# Patient Record
Sex: Male | Born: 1970 | Race: Black or African American | Hispanic: No | Marital: Married | Smoking: Never smoker
Health system: Southern US, Community
[De-identification: ages and names within clinical notes are randomized; demographics above are authoritative.]

## PROBLEM LIST (undated history)

## (undated) DIAGNOSIS — K219 Gastro-esophageal reflux disease without esophagitis: Secondary | ICD-10-CM

## (undated) DIAGNOSIS — T7840XA Allergy, unspecified, initial encounter: Secondary | ICD-10-CM

## (undated) DIAGNOSIS — K2970 Gastritis, unspecified, without bleeding: Secondary | ICD-10-CM

## (undated) DIAGNOSIS — K210 Gastro-esophageal reflux disease with esophagitis: Secondary | ICD-10-CM

## (undated) HISTORY — DX: Gastritis, unspecified, without bleeding: K29.70

## (undated) HISTORY — PX: COLONOSCOPY: SHX174

## (undated) HISTORY — DX: Allergy, unspecified, initial encounter: T78.40XA

## (undated) HISTORY — DX: Gastro-esophageal reflux disease with esophagitis: K21.0

## (undated) HISTORY — DX: Gastro-esophageal reflux disease without esophagitis: K21.9

## (undated) HISTORY — PX: ESOPHAGOGASTRODUODENOSCOPY: SHX1529

---

## 2006-07-07 ENCOUNTER — Emergency Department: Payer: Self-pay | Admitting: Emergency Medicine

## 2011-08-26 ENCOUNTER — Ambulatory Visit: Payer: Self-pay | Admitting: Gastroenterology

## 2012-09-13 ENCOUNTER — Encounter (HOSPITAL_COMMUNITY): Payer: Self-pay | Admitting: Emergency Medicine

## 2012-09-13 ENCOUNTER — Emergency Department (HOSPITAL_COMMUNITY)
Admission: EM | Admit: 2012-09-13 | Discharge: 2012-09-13 | Disposition: A | Discharge status: Home / Self Care | Payer: Worker's Compensation | Attending: Emergency Medicine | Admitting: Emergency Medicine

## 2012-09-13 ENCOUNTER — Emergency Department (HOSPITAL_COMMUNITY): Payer: Worker's Compensation

## 2012-09-13 DIAGNOSIS — T148XXA Other injury of unspecified body region, initial encounter: Secondary | ICD-10-CM

## 2012-09-13 DIAGNOSIS — Y9289 Other specified places as the place of occurrence of the external cause: Secondary | ICD-10-CM | POA: Insufficient documentation

## 2012-09-13 DIAGNOSIS — S9780XA Crushing injury of unspecified foot, initial encounter: Secondary | ICD-10-CM | POA: Insufficient documentation

## 2012-09-13 DIAGNOSIS — W208XXA Other cause of strike by thrown, projected or falling object, initial encounter: Secondary | ICD-10-CM | POA: Insufficient documentation

## 2012-09-13 DIAGNOSIS — S99922A Unspecified injury of left foot, initial encounter: Secondary | ICD-10-CM

## 2012-09-13 DIAGNOSIS — Y939 Activity, unspecified: Secondary | ICD-10-CM | POA: Insufficient documentation

## 2012-09-13 MED ORDER — HYDROCODONE-ACETAMINOPHEN 5-325 MG PO TABS
1.0000 | ORAL_TABLET | Freq: Once | ORAL | Status: AC
  Administered 2012-09-13: 1 via ORAL
  Filled 2012-09-13: qty 1

## 2012-09-13 NOTE — ED Notes (Signed)
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

## 2012-09-13 NOTE — ED Provider Notes (Signed)
History  Scribed for Dustin Pel, PA-C/ Dustin Razor, MD, the patient was seen in room WTR6/WTR6. This chart was scribed by Dustin Brady. The patient's care started at 9:00 PM   CSN: [id number]  Arrival date & time 09/13/12  1946   First MD Initiated Contact with Patient 09/13/12 2001      Chief Complaint  Patient presents with  . Foot Injury     The history is provided by the patient. No language interpreter was used.   Dustin Brady is a 42 y.o. male who presents to the Emergency Department complaining of left foot pain after a piece of metal fell off a truck and landed on his foot while at work earlier today.  Pt reports that the pain is worse with walking.  He is experiencing swelling of the foot.  He has no other injuries.  He has taken nothing for the pain.  History reviewed. No pertinent past medical history.  Past Surgical History  Procedure Date  . Colonoscopy     Family History  Problem Relation Age of Onset  . Hypertension Father   . Diabetes Father   . Stroke Father   . Cancer Other     History  Substance Use Topics  . Smoking status: Never Smoker   . Smokeless tobacco: Not on file  . Alcohol Use: No      Review of Systems  Constitutional: Negative for fever and chills.  Respiratory: Negative for shortness of breath.   Gastrointestinal: Negative for nausea and vomiting.  Musculoskeletal: Positive for arthralgias (left foot pain).  Neurological: Negative for weakness.  All other systems reviewed and are negative.    Allergies  Review of patient's allergies indicates no known allergies.  Home Medications   Current Outpatient Rx  Name  Route  Sig  Dispense  Refill  . IBUPROFEN 200 MG PO TABS   Oral   Take 400 mg by mouth every 6 (six) hours as needed. Pain.         . ADULT MULTIVITAMIN W/MINERALS CH   Oral   Take 1 tablet by mouth daily.           BP 132/91  Pulse 68  Temp 98.7 F (37.1 C) (Oral)  Resp 14  SpO2  96%  Physical Exam  Nursing note and vitals reviewed. Constitutional: He is oriented to person, place, and time. He appears well-developed and well-nourished. No distress.  HENT:  Head: Normocephalic and atraumatic.  Eyes: EOM are normal.  Neck: Neck supple. No tracheal deviation present.  Cardiovascular: Normal rate.   Pulmonary/Chest: Effort normal. No respiratory distress.  Musculoskeletal: Normal range of motion.       Normal sensation to light touch.  Significant amount of swelling to left foot.  Normal ROM of ankle and toes.  Unable to apply pressure.  Normal cap refill.    Neurological: He is alert and oriented to person, place, and time.  Skin: Skin is warm and dry.  Psychiatric: He has a normal mood and affect. His behavior is normal.    ED Course  Procedures   DIAGNOSTIC STUDIES: Oxygen Saturation is 96% on room air, normal by my interpretation.    COORDINATION OF CARE:  8:03PM Ordered: DG Foot Complete Left 9:02 PM Discussed imaging results with pt.  Advised pt to return to work with light duty.  Will order pain medication now.  Will order crutches and post op boot.  Advised pt to elevate, ice, elevate, and take ibuprofen for  the pain.  Pt understands and agrees.   9:07PM Ordered: HYDROcodone-acetaminophen (NORCO/VICODIN) 5-325 MG per tablet 1 tablet-once  9:08PM Ordered: Crutches; Post op boot, referral to Ortho.  Labs Reviewed - No data to display Dg Foot Complete Left  09/13/2012  *RADIOLOGY REPORT*  Clinical Data: Piece of metal fell on foot.  Pain.  LEFT FOOT - COMPLETE 3+ VIEW  Comparison: None.  Findings:  Mild soft tissue prominence dorsal foot.  No fracture or dislocation.  No radiopaque foreign body.  IMPRESSION: No fracture.  Please see above.   Original Report Authenticated By: Dustin Brady, M.D.      1. Crush injury   2. Injury of foot, left       MDM  Pt has been advised of the symptoms that warrant their return to the ED. Patient has voiced  understanding and has agreed to follow-up with the PCP or specialist.  I personally performed the services described in this documentation, which was scribed in my presence. The recorded information has been reviewed and is accurate.          Dustin Matas, PA 09/13/12 [phone]

## 2012-09-13 NOTE — ED Provider Notes (Signed)
Medical screening examination/treatment/procedure(s) were performed by non-physician practitioner and as supervising physician I was immediately available for consultation/collaboration.   , MD 09/13/12 2333 

## 2012-09-19 ENCOUNTER — Other Ambulatory Visit: Payer: Self-pay | Admitting: Family Medicine

## 2012-09-19 ENCOUNTER — Ambulatory Visit
Admission: RE | Admit: 2012-09-19 | Discharge: 2012-09-19 | Disposition: A | Discharge status: Home / Self Care | Payer: Worker's Compensation | Source: Ambulatory Visit | Attending: Family Medicine | Admitting: Family Medicine

## 2012-09-19 DIAGNOSIS — M79672 Pain in left foot: Secondary | ICD-10-CM

## 2014-05-01 ENCOUNTER — Emergency Department: Payer: Self-pay | Admitting: Emergency Medicine

## 2014-12-02 IMAGING — CR RIGHT ELBOW - COMPLETE 3+ VIEW
1 series · 4 of 4 positions shown · non-contrast
Comparison: None.

CLINICAL DATA: Injury to right elbow, with soft tissue swelling and
pain.

EXAM:
RIGHT ELBOW - COMPLETE 3+ VIEW

[Series 1: x elbow lat right · 0.14mm/px · 4 of 4 slices shown]
[im 1/4]
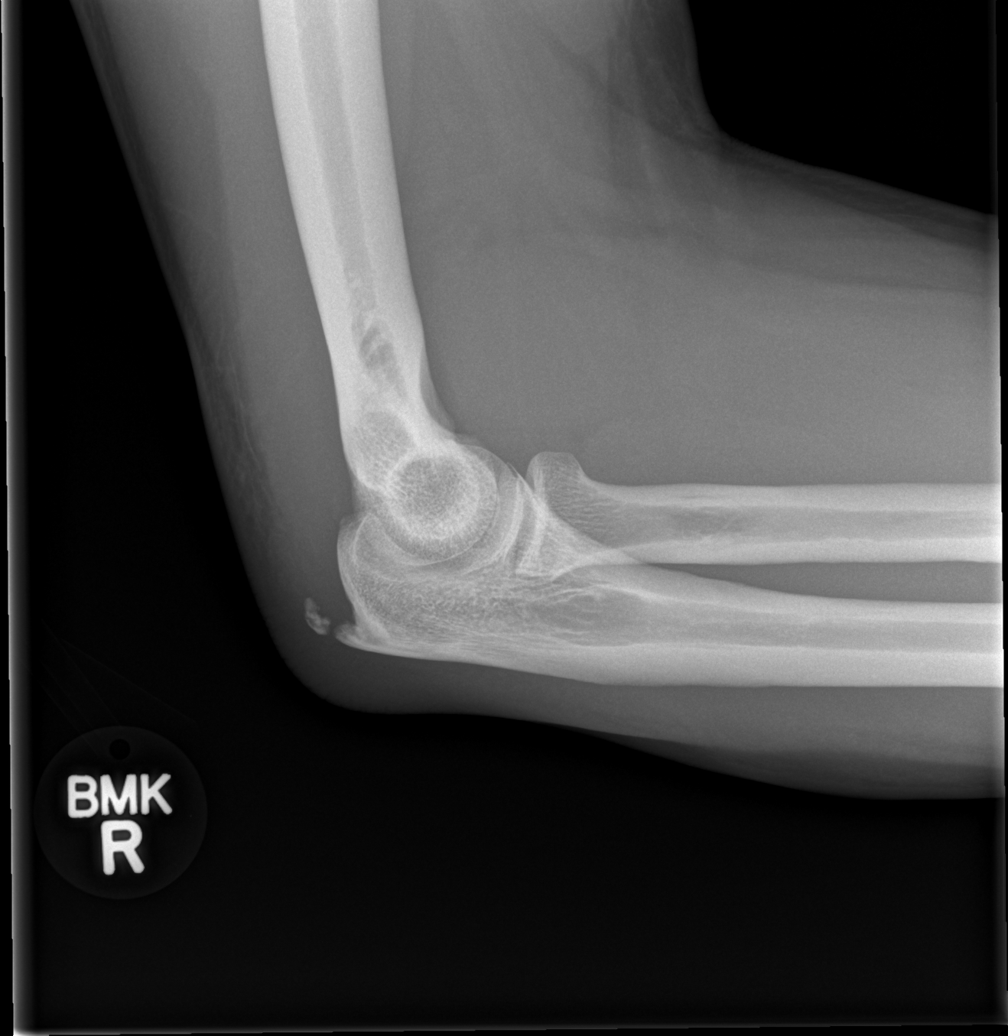
[im 2/4]
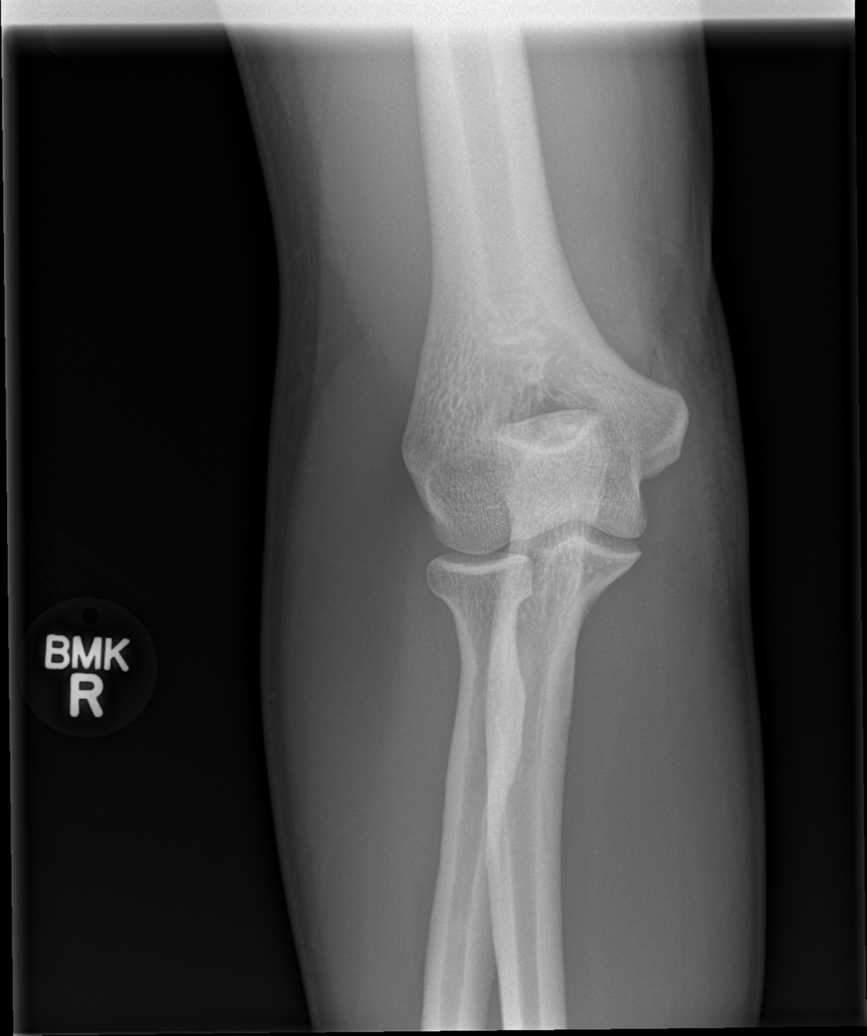
[im 3/4]
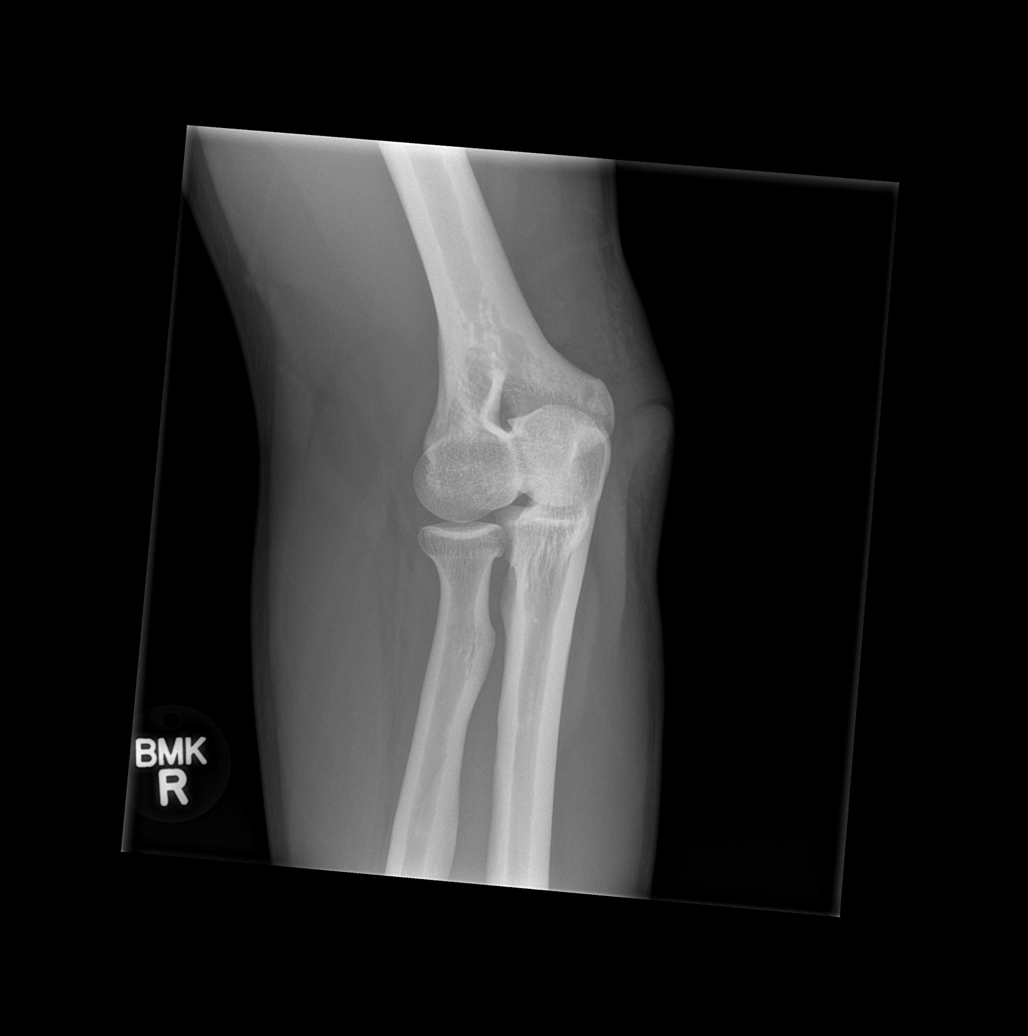
[im 4/4]
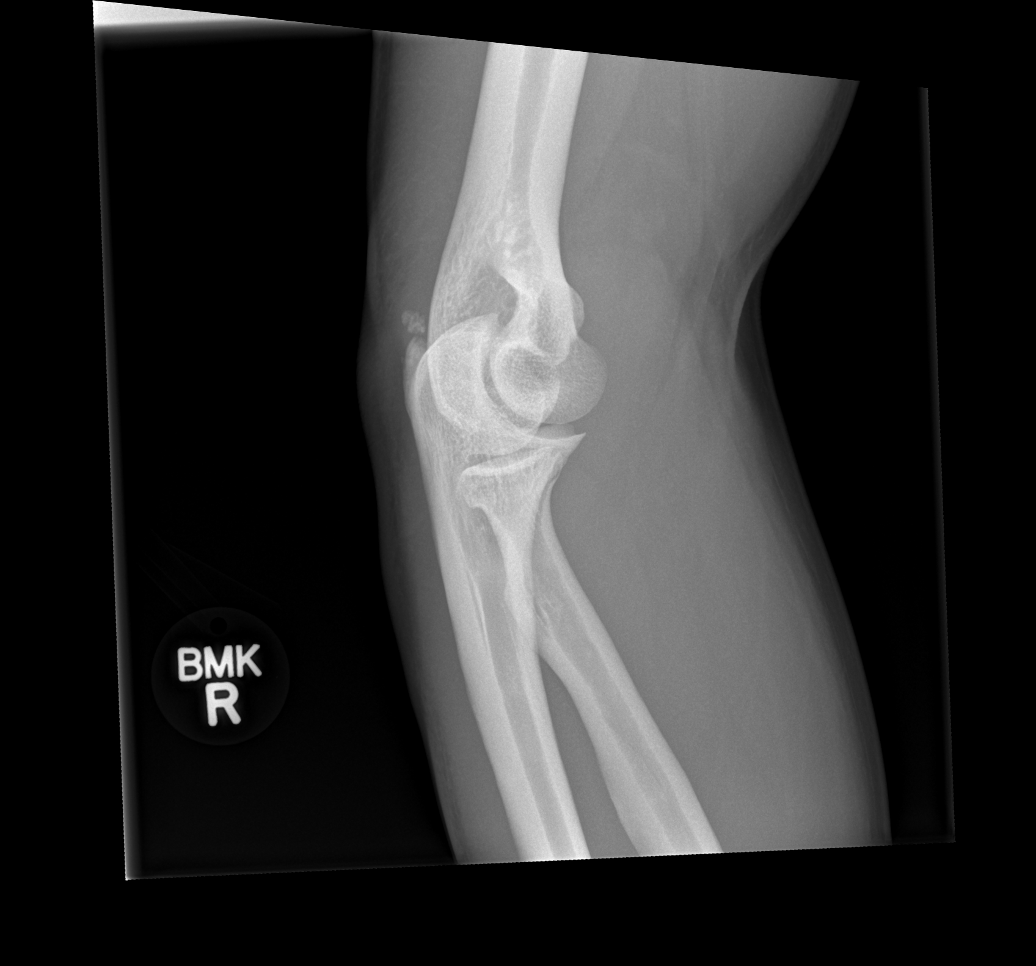

[4 of 4 positions shown; findings below may reference images not displayed]

FINDINGS: There is no evidence of fracture or dislocation. Focal calcification
is noted at the level of an enthesophyte along the olecranon. The
visualized joint spaces are preserved. No significant joint effusion
is identified. Dorsal soft tissue swelling is noted at the
olecranon.
IMPRESSION: 1. No evidence of fracture or dislocation.
2. Focal calcification noted at the level of an enthesophyte along
the olecranon, possibly reflecting remote injury.

## 2015-01-27 ENCOUNTER — Other Ambulatory Visit: Payer: Self-pay | Admitting: Family Medicine

## 2016-04-12 ENCOUNTER — Other Ambulatory Visit: Payer: Self-pay | Admitting: Family Medicine

## 2016-04-27 ENCOUNTER — Ambulatory Visit (INDEPENDENT_AMBULATORY_CARE_PROVIDER_SITE_OTHER): Payer: BLUE CROSS/BLUE SHIELD | Admitting: Family Medicine

## 2016-04-27 ENCOUNTER — Encounter: Payer: Self-pay | Admitting: Family Medicine

## 2016-04-27 DIAGNOSIS — R61 Generalized hyperhidrosis: Secondary | ICD-10-CM | POA: Insufficient documentation

## 2016-04-27 DIAGNOSIS — K219 Gastro-esophageal reflux disease without esophagitis: Secondary | ICD-10-CM

## 2016-04-27 DIAGNOSIS — R1904 Left lower quadrant abdominal swelling, mass and lump: Secondary | ICD-10-CM

## 2016-04-27 LAB — CBC WITH DIFFERENTIAL/PLATELET
BASOS ABS: 62 {cells}/uL (ref 0–200)
Basophils Relative: 1 %
EOS PCT: 3 %
Eosinophils Absolute: 186 cells/uL (ref 15–500)
HCT: 44.7 % (ref 38.5–50.0)
Hemoglobin: 15.3 g/dL (ref 13.2–17.1)
Lymphocytes Relative: 41 %
Lymphs Abs: 2542 cells/uL (ref 850–3900)
MCH: 31.2 pg (ref 27.0–33.0)
MCHC: 34.2 g/dL (ref 32.0–36.0)
MCV: 91.2 fL (ref 80.0–100.0)
MONOS PCT: 7 %
MPV: 11.9 fL (ref 7.5–12.5)
Monocytes Absolute: 434 cells/uL (ref 200–950)
NEUTROS ABS: 2976 {cells}/uL (ref 1500–7800)
Neutrophils Relative %: 48 %
PLATELETS: 233 10*3/uL (ref 140–400)
RBC: 4.9 MIL/uL (ref 4.20–5.80)
RDW: 13.7 % (ref 11.0–15.0)
WBC: 6.2 10*3/uL (ref 3.8–10.8)

## 2016-04-27 LAB — COMPREHENSIVE METABOLIC PANEL
ALBUMIN: 4.5 g/dL (ref 3.6–5.1)
ALK PHOS: 32 U/L — AB (ref 40–115)
ALT: 21 U/L (ref 9–46)
AST: 20 U/L (ref 10–40)
BILIRUBIN TOTAL: 0.5 mg/dL (ref 0.2–1.2)
BUN: 15 mg/dL (ref 7–25)
CO2: 27 mmol/L (ref 20–31)
CREATININE: 1.19 mg/dL (ref 0.60–1.35)
Calcium: 10 mg/dL (ref 8.6–10.3)
Chloride: 106 mmol/L (ref 98–110)
Glucose, Bld: 88 mg/dL (ref 65–99)
Potassium: 5 mmol/L (ref 3.5–5.3)
SODIUM: 138 mmol/L (ref 135–146)
TOTAL PROTEIN: 7.6 g/dL (ref 6.1–8.1)

## 2016-04-27 LAB — LACTATE DEHYDROGENASE: LDH: 143 U/L (ref 100–220)

## 2016-04-27 MED ORDER — PANTOPRAZOLE SODIUM 40 MG PO TBEC: 40.0000 mg | DELAYED_RELEASE_TABLET | Freq: Every day | ORAL | 3 refills | Status: DC

## 2016-04-27 NOTE — Assessment & Plan Note (Signed)
Check LDH 

## 2016-04-27 NOTE — Assessment & Plan Note (Signed)
Avoid triggers, protonix, labs

## 2016-04-27 NOTE — Progress Notes (Signed)
BP 120/74   Pulse 79   Temp 98.3 F (36.8 C) (Oral)   Ht 5\' 8"  (1.727 m)   Wt 208 lb (94.3 kg)   SpO2 97%   BMI 31.63 kg/m    Subjective:    Patient ID: Dustin Brady, male    DOB: [birth year], 45 y.o.   MRN: [id number]  HPI: Dustin Brady is a 45 y.o. male  Chief Complaint  Patient presents with  . Medication Refill  . LLQ    abdomen   Patient is here to get acid reflux refilled; used to take omeprazole but last dose months and months ago; helps and started back just taking one time to time; caused bloating daily Pretty bad pain over the left side; just started a week ago, off and on To him, it feels like that side is hard and swollen; working physically and works out [employer] mid left side and moves up to the LUQ Wants to start back on acid reflux Certain foods make acid reflux worse; typical ones I asked about blood in the stool, but not lately; he had colonoscopy, maybe 3-4 years ago He does not take much medicine, ibuprofen  Not weight loss; sometimes has night sweats; going on for years; father is the same way Father has diabetes; mother has MS; no GI malignancies; not sure about ulcer  Depression screen [facility] 2/9 04/27/2016  Decreased Interest 0  Down, Depressed, Hopeless 0  PHQ - 2 Score 0   Relevant past medical, surgical, family and social history reviewed Past Medical History:  Diagnosis Date  . GERD (gastroesophageal reflux disease)    Past Surgical History:  Procedure Laterality Date  . COLONOSCOPY     Family History  Problem Relation Age of Onset  . Hypertension Father   . Diabetes Father   . Stroke Father   . Cancer Other    Social History  Substance Use Topics  . Smoking status: Never Smoker  . Smokeless tobacco: Not on file  . Alcohol use No   Interim medical history since last visit reviewed. Allergies and medications reviewed  Review of Systems Per HPI unless specifically indicated above     Objective:    BP 120/74   Pulse 79   Temp  98.3 F (36.8 C) (Oral)   Ht 5\' 8"  (1.727 m)   Wt 208 lb (94.3 kg)   SpO2 97%   BMI 31.63 kg/m   Wt Readings from Last 3 Encounters:  04/27/16 208 lb (94.3 kg)    Physical Exam  Constitutional: He appears well-developed and well-nourished. No distress.  HENT:  Head: Normocephalic and atraumatic.  Eyes: EOM are normal. No scleral icterus.  Neck: No thyromegaly present.  Cardiovascular: Normal rate and regular rhythm.   Pulmonary/Chest: Effort normal and breath sounds normal.  Abdominal: Soft. Bowel sounds are normal. He exhibits mass (fullness in the LLQ). He exhibits no distension.  Musculoskeletal: He exhibits no edema.  Neurological: He is alert.  Skin: Skin is warm and dry. No pallor.  Psychiatric: He has a normal mood and affect. His behavior is normal. Judgment and thought content normal.      Assessment & Plan:   Problem List Items Addressed This Visit      Digestive   Acid reflux    Avoid triggers, protonix, labs      Relevant Medications   pantoprazole (PROTONIX) 40 MG tablet   Other Relevant Orders   Helicobacter pylori antigen det, stool     Other  Night sweats    Check LDH      Relevant Orders   Lactate Dehydrogenase (LDH) (Completed)   Abdominal mass, left lower quadrant    Check labs and CT scan      Relevant Orders   CBC with Differential/Platelet (Completed)   Urinalysis w microscopic + reflex cultur (Completed)   Comprehensive Metabolic Panel (CMET) (Completed)   CT Abdomen Pelvis W Contrast    Other Visit Diagnoses   None.     Follow up plan: Return in about 1 month (around 05/27/2016) for symptoms.  An after-visit summary was printed and given to the patient at check-out.  Please see the patient instructions which may contain other information and recommendations beyond what is mentioned above in the assessment and plan.  Meds ordered this encounter  Medications  . pantoprazole (PROTONIX) 40 MG tablet    Sig: Take 1 tablet (40 mg  total) by mouth daily.    Dispense:  30 tablet    Refill:  3    Orders Placed This Encounter  Procedures  . Helicobacter pylori special antigen  . CT Abdomen Pelvis W Contrast  . CBC with Differential/Platelet  . Urinalysis w microscopic + reflex cultur  . Helicobacter pylori antigen det, stool  . Comprehensive Metabolic Panel (CMET)  . Lactate Dehydrogenase (LDH)

## 2016-04-27 NOTE — Assessment & Plan Note (Signed)
Check labs and CT scan

## 2016-04-27 NOTE — Patient Instructions (Addendum)
We'll check labs today We'll get a CT scan If you have not heard anything from my staff in a week about any orders/referrals/studies from today, please contact us here to follow-up [phone] Try the pantoprazole daily for one month Avoid triggers  Gastroesophageal Reflux Disease, Adult Normally, food travels down the esophagus and stays in the stomach to be digested. However, when a person has gastroesophageal reflux disease (GERD), food and stomach acid move back up into the esophagus. When this happens, the esophagus becomes sore and inflamed. Over time, GERD can create small holes (ulcers) in the lining of the esophagus.  CAUSES This condition is caused by a problem with the muscle between the esophagus and the stomach (lower esophageal sphincter, or LES). Normally, the LES muscle closes after food passes through the esophagus to the stomach. When the LES is weakened or abnormal, it does not close properly, and that allows food and stomach acid to go back up into the esophagus. The LES can be weakened by certain dietary substances, medicines, and medical conditions, including:  Tobacco use.  Pregnancy.  Having a hiatal hernia.  Heavy alcohol use.  Certain foods and beverages, such as coffee, chocolate, onions, and peppermint. RISK FACTORS This condition is more likely to develop in:  People who have an increased body weight.  People who have connective tissue disorders.  People who use NSAID medicines. SYMPTOMS Symptoms of this condition include:  Heartburn.  Difficult or painful swallowing.  The feeling of having a lump in the throat.  Abitter taste in the mouth.  Bad breath.  Having a large amount of saliva.  Having an upset or bloated stomach.  Belching.  Chest pain.  Shortness of breath or wheezing.  Ongoing (chronic) cough or a night-time cough.  Wearing away of tooth enamel.  Weight loss. Different conditions can cause chest pain. Make sure to  see your health care provider if you experience chest pain. DIAGNOSIS Your health care provider will take a medical history and perform a physical exam. To determine if you have mild or severe GERD, your health care provider may also monitor how you respond to treatment. You may also have other tests, including:  An endoscopy toexamine your stomach and esophagus with a small camera.  A test thatmeasures the acidity level in your esophagus.  A test thatmeasures how much pressure is on your esophagus.  A barium swallow or modified barium swallow to show the shape, size, and functioning of your esophagus. TREATMENT The goal of treatment is to help relieve your symptoms and to prevent complications. Treatment for this condition may vary depending on how severe your symptoms are. Your health care provider may recommend:  Changes to your diet.  Medicine.  Surgery. HOME CARE INSTRUCTIONS Diet  Follow a diet as recommended by your health care provider. This may involve avoiding foods and drinks such as:  Coffee and tea (with or without caffeine).  Drinks that containalcohol.  Energy drinks and sports drinks.  Carbonated drinks or sodas.  Chocolate and cocoa.  Peppermint and mint flavorings.  Garlic and onions.  Horseradish.  Spicy and acidic foods, including peppers, chili powder, curry powder, vinegar, hot sauces, and barbecue sauce.  Citrus fruit juices and citrus fruits, such as oranges, lemons, and limes.  Tomato-based foods, such as red sauce, chili, salsa, and pizza with red sauce.  Fried and fatty foods, such as donuts, french fries, potato chips, and high-fat dressings.  High-fat meats, such as hot dogs and fatty cuts  of red and white meats, such as rib eye steak, sausage, ham, and bacon.  High-fat dairy items, such as whole milk, butter, and cream cheese.  Eat small, frequent meals instead of large meals.  Avoid drinking large amounts of liquid with your  meals.  Avoid eating meals during the 2-3 hours before bedtime.  Avoid lying down right after you eat.  Do not exercise right after you eat. General Instructions  Pay attention to any changes in your symptoms.  Take over-the-counter and prescription medicines only as told by your health care provider. Do not take aspirin, ibuprofen, or other NSAIDs unless your health care provider told you to do so.  Do not use any tobacco products, including cigarettes, chewing tobacco, and e-cigarettes. If you need help quitting, ask your health care provider.  Wear loose-fitting clothing. Do not wear anything tight around your waist that causes pressure on your abdomen.  Raise (elevate) the head of your bed 6 inches (15cm).  Try to reduce your stress, such as with yoga or meditation. If you need help reducing stress, ask your health care provider.  If you are overweight, reduce your weight to an amount that is healthy for you. Ask your health care provider for guidance about a safe weight loss goal.  Keep all follow-up visits as told by your health care provider. This is important. SEEK MEDICAL CARE IF:  You have new symptoms.  You have unexplained weight loss.  You have difficulty swallowing, or it hurts to swallow.  You have wheezing or a persistent cough.  Your symptoms do not improve with treatment.  You have a hoarse voice. SEEK IMMEDIATE MEDICAL CARE IF:  You have pain in your arms, neck, jaw, teeth, or back.  You feel sweaty, dizzy, or light-headed.  You have chest pain or shortness of breath.  You vomit and your vomit looks like blood or coffee grounds.  You faint.  Your stool is bloody or black.  You cannot swallow, drink, or eat.   This information is not intended to replace advice given to you by your health care provider. Make sure you discuss any questions you have with your health care provider.   Document Released: 05/11/2005 Document Revised: 04/22/2015  Document Reviewed: 11/26/2014 Elsevier Interactive Patient Education [employer]

## 2016-04-28 ENCOUNTER — Other Ambulatory Visit: Payer: Self-pay | Admitting: Family Medicine

## 2016-04-28 LAB — URINALYSIS W MICROSCOPIC + REFLEX CULTURE
BILIRUBIN URINE: NEGATIVE
Bacteria, UA: NONE SEEN [HPF]
CASTS: NONE SEEN [LPF]
CRYSTALS: NONE SEEN [HPF]
Glucose, UA: NEGATIVE
Hgb urine dipstick: NEGATIVE
Ketones, ur: NEGATIVE
Leukocytes, UA: NEGATIVE
Nitrite: NEGATIVE
Protein, ur: NEGATIVE
RBC / HPF: NONE SEEN RBC/HPF (ref <=2)
SPECIFIC GRAVITY, URINE: 1.021 (ref 1.001–1.035)
Squamous Epithelial / LPF: NONE SEEN [HPF] (ref <=5)
WBC UA: NONE SEEN WBC/HPF (ref <=5)
Yeast: NONE SEEN [HPF]
pH: 6 (ref 5.0–8.0)

## 2016-04-29 ENCOUNTER — Telehealth: Payer: Self-pay

## 2016-04-29 LAB — HELICOBACTER PYLORI  SPECIAL ANTIGEN: H. PYLORI ANTIGEN STOOL: NOT DETECTED

## 2016-04-29 NOTE — Telephone Encounter (Signed)
Forgot to document on result notes of pt lab. Pt notified on neg urine.

## 2016-05-02 ENCOUNTER — Ambulatory Visit: Admission: RE | Admit: 2016-05-02 | Payer: BLUE CROSS/BLUE SHIELD | Source: Ambulatory Visit

## 2016-05-05 ENCOUNTER — Telehealth: Payer: Self-pay | Admitting: Family Medicine

## 2016-05-05 ENCOUNTER — Other Ambulatory Visit: Payer: Self-pay

## 2016-05-05 DIAGNOSIS — R1032 Left lower quadrant pain: Secondary | ICD-10-CM

## 2016-05-05 NOTE — Telephone Encounter (Signed)
Please let pt know we have his scan results I'm sorry to hear that he vomited at the test Is he feeling better in that regard? He has diverticulosis of the colon, which are little outpouching of the walls of the large intestine, but it is not infected (not diverticulitis) He has mild degenerative changes in his back Overall, no masses, no hernia, no gallstones, no kidney stones No enlarged lymph nodes, no evidence of lymphoma If he's continuing to have bad reflux and pain in the LUQ, then please refer him to GI Call if any ongoing issues

## 2016-05-05 NOTE — Telephone Encounter (Signed)
Pt notified of results and would like to go ahead with GI referral.  It has been placed.

## 2016-05-26 ENCOUNTER — Ambulatory Visit: Payer: BLUE CROSS/BLUE SHIELD | Admitting: Family Medicine

## 2016-06-21 ENCOUNTER — Other Ambulatory Visit: Payer: Self-pay

## 2016-06-21 DIAGNOSIS — K219 Gastro-esophageal reflux disease without esophagitis: Secondary | ICD-10-CM

## 2016-06-21 NOTE — Telephone Encounter (Signed)
Ins requesting 90 day supply 

## 2016-06-22 MED ORDER — PANTOPRAZOLE SODIUM 40 MG PO TBEC: 40.0000 mg | DELAYED_RELEASE_TABLET | Freq: Every day | ORAL | 0 refills | Status: DC

## 2016-06-27 ENCOUNTER — Telehealth: Payer: Self-pay | Admitting: Family Medicine

## 2016-06-27 NOTE — Telephone Encounter (Signed)
PT WIFE SAYS PT NEEDS REFILL ON PANTOPRAZOLE SOD DR 40 MG. PT SAYS THAT INS WILL ON REFILL A 30 DAY SUPPLY NEES TO BE 90 DAY SUPPLY. PHARM IS [facility] [address]

## 2016-07-04 ENCOUNTER — Ambulatory Visit: Payer: BLUE CROSS/BLUE SHIELD | Admitting: Family Medicine

## 2016-08-16 ENCOUNTER — Telehealth: Payer: Self-pay | Admitting: Emergency Medicine

## 2016-08-16 ENCOUNTER — Other Ambulatory Visit: Payer: Self-pay | Admitting: Emergency Medicine

## 2016-08-16 MED ORDER — OSELTAMIVIR PHOSPHATE 75 MG PO CAPS: 75.0000 mg | ORAL_CAPSULE | Freq: Two times a day (BID) | ORAL | 0 refills | Status: DC

## 2016-08-16 NOTE — Telephone Encounter (Signed)
Mr. Manson PasseyBrown wife called and stated that Lisbeth PlyJulius is having flu symptoms. [facility]. Per Dr. Carlynn PurlSowles call in Tamiflu 75 mg twice a day for 5 days. [facility] [address]

## 2016-09-29 ENCOUNTER — Other Ambulatory Visit: Payer: Self-pay | Admitting: Family Medicine

## 2016-09-29 DIAGNOSIS — K219 Gastro-esophageal reflux disease without esophagitis: Secondary | ICD-10-CM

## 2016-09-29 NOTE — Telephone Encounter (Signed)
I referred him to gastroenterologist in Sept; please ask him to contact his GI doctor, as the medicine may have been changed, or other f/u may be needed; thank you

## 2016-10-05 ENCOUNTER — Encounter: Payer: Self-pay | Admitting: Family Medicine

## 2016-10-05 DIAGNOSIS — K2970 Gastritis, unspecified, without bleeding: Secondary | ICD-10-CM

## 2016-10-05 DIAGNOSIS — K2100 Gastro-esophageal reflux disease with esophagitis, without bleeding: Secondary | ICD-10-CM

## 2016-10-05 HISTORY — DX: Gastritis, unspecified, without bleeding: K29.70

## 2016-10-05 HISTORY — DX: Gastro-esophageal reflux disease with esophagitis, without bleeding: K21.00

## 2016-10-06 ENCOUNTER — Encounter: Payer: Self-pay | Admitting: Family Medicine

## 2016-11-04 ENCOUNTER — Encounter: Payer: Self-pay | Admitting: *Deleted

## 2016-11-07 ENCOUNTER — Ambulatory Visit
Admission: RE | Admit: 2016-11-07 | Discharge: 2016-11-07 | Disposition: A | Discharge status: Home / Self Care | Payer: BLUE CROSS/BLUE SHIELD | Source: Ambulatory Visit | Attending: Gastroenterology | Admitting: Gastroenterology

## 2016-11-07 ENCOUNTER — Encounter
Admission: RE | Disposition: A | Discharge status: Home / Self Care | Payer: Self-pay | Source: Ambulatory Visit | Attending: Gastroenterology

## 2016-11-07 ENCOUNTER — Ambulatory Visit: Payer: BLUE CROSS/BLUE SHIELD | Admitting: Certified Registered Nurse Anesthetist

## 2016-11-07 ENCOUNTER — Encounter: Payer: Self-pay | Admitting: *Deleted

## 2016-11-07 DIAGNOSIS — K210 Gastro-esophageal reflux disease with esophagitis: Secondary | ICD-10-CM | POA: Diagnosis not present

## 2016-11-07 DIAGNOSIS — Z09 Encounter for follow-up examination after completed treatment for conditions other than malignant neoplasm: Secondary | ICD-10-CM | POA: Insufficient documentation

## 2016-11-07 DIAGNOSIS — K2950 Unspecified chronic gastritis without bleeding: Secondary | ICD-10-CM | POA: Insufficient documentation

## 2016-11-07 HISTORY — PX: ESOPHAGOGASTRODUODENOSCOPY (EGD) WITH PROPOFOL: SHX5813

## 2016-11-07 SURGERY — ESOPHAGOGASTRODUODENOSCOPY (EGD) WITH PROPOFOL
Anesthesia: General

## 2016-11-07 MED ORDER — LIDOCAINE HCL (CARDIAC) 20 MG/ML IV SOLN
INTRAVENOUS | Status: DC | PRN
  Administered 2016-11-07: 100 mg via INTRAVENOUS

## 2016-11-07 MED ORDER — LIDOCAINE HCL (PF) 2 % IJ SOLN
INTRAMUSCULAR | Status: AC
  Filled 2016-11-07: qty 2

## 2016-11-07 MED ORDER — FENTANYL CITRATE (PF) 100 MCG/2ML IJ SOLN
INTRAMUSCULAR | Status: DC | PRN
  Administered 2016-11-07: 50 ug via INTRAVENOUS

## 2016-11-07 MED ORDER — SODIUM CHLORIDE 0.9 % IV SOLN: INTRAVENOUS | Status: DC

## 2016-11-07 MED ORDER — PROPOFOL 10 MG/ML IV BOLUS
INTRAVENOUS | Status: DC | PRN
  Administered 2016-11-07: 70 mg via INTRAVENOUS

## 2016-11-07 MED ORDER — SODIUM CHLORIDE 0.9 % IV SOLN
INTRAVENOUS | Status: DC
  Administered 2016-11-07: 13:00:00 via INTRAVENOUS

## 2016-11-07 MED ORDER — FENTANYL CITRATE (PF) 100 MCG/2ML IJ SOLN
INTRAMUSCULAR | Status: AC
  Filled 2016-11-07: qty 2

## 2016-11-07 MED ORDER — PROPOFOL 500 MG/50ML IV EMUL
INTRAVENOUS | Status: DC | PRN
  Administered 2016-11-07: 140 ug/kg/min via INTRAVENOUS

## 2016-11-07 MED ORDER — PROPOFOL 500 MG/50ML IV EMUL
INTRAVENOUS | Status: AC
  Filled 2016-11-07: qty 50

## 2016-11-07 NOTE — Anesthesia Post-op Follow-up Note (Cosign Needed)
Anesthesia QCDR form completed.        

## 2016-11-07 NOTE — Transfer of Care (Signed)
Immediate Anesthesia Transfer of Care Note  Patient: Dustin Brady  Procedure(s) Performed: Procedure(s): ESOPHAGOGASTRODUODENOSCOPY (EGD) WITH PROPOFOL (N/A)  Patient Location: PACU  Anesthesia Type:General  Level of Consciousness: sedated  Airway & Oxygen Therapy: Patient Spontanous Breathing and Patient connected to nasal cannula oxygen  Post-op Assessment: Report given to RN and Post -op Vital signs reviewed and stable  Post vital signs: Reviewed and stable  Last Vitals:  Vitals:   11/07/16 1255  BP: 132/90  Pulse: 69  Resp: 20  Temp: 36.5 C    Last Pain:  Vitals:   11/07/16 1255  TempSrc: Tympanic         Complications: No apparent anesthesia complications

## 2016-11-07 NOTE — H&P (Signed)
Outpatient short stay form Pre-procedure 11/07/2016 1:19 PM Dustin DeemMartin U  MD  Primary Physician: Dr. Baruch GoutyMelinda Lada  Reason for visit:  EGD  History of present illness:  Patient is a 46 year old male presenting today as above. He had an EGD on 08/24/2016 for issues with heartburn. He was found to have a severe erosive gastritis this was negative for local back to pylori. He is returning today for follow-up. He is been continuing on a proton pump inhibitor currently twice daily. He has no further symptoms. He takes no over-the-counter NSAIDs or aspirin products currently.    Current Facility-Administered Medications:  .  0.9 %  sodium chloride infusion, , Intravenous, Continuous, Dustin Brady , MD, Last Rate: 20 mL/hr at 11/07/16 1311 .  0.9 %  sodium chloride infusion, , Intravenous, Continuous, Dustin Brady , MD  Prescriptions Prior to Admission  Medication Sig Dispense Refill Last Dose  . Multiple Vitamin (MULTIVITAMIN WITH MINERALS) TABS Take 1 tablet by mouth daily.   Past Month at Unknown time  . pantoprazole (PROTONIX) 40 MG tablet Take 1 tablet (40 mg total) by mouth daily. 90 tablet 0 11/06/2016 at Unknown time  . oseltamivir (TAMIFLU) 75 MG capsule Take 1 capsule (75 mg total) by mouth 2 (two) times daily. (Patient not taking: Reported on 11/07/2016) 10 capsule 0 Completed Course at Unknown time     No Known Allergies   Past Medical History:  Diagnosis Date  . Chronic reflux esophagitis 10/05/2016  . Gastritis 10/05/2016   Mild to moderate chronic gastritis, EGD Jan 2018, Dr. Marva Panda  . GERD (gastroesophageal reflux disease)     Review of systems:      Physical Exam    Heart and lungs: Regular rate and rhythm without rub or gallop, lungs are bilaterally clear.    HEENT: Normocephalic atraumatic eyes are anicteric    Other:     Pertinant exam for procedure: Soft nontender nondistended bowel sounds positive normoactive.    Planned proceedures: EGD and  indicated procedures. I have discussed the risks benefits and complications of procedures to include not limited to bleeding, infection, perforation and the risk of sedation and the patient wishes to proceed.    Dustin Brady , MD Gastroenterology 11/07/2016  1:19 PM

## 2016-11-07 NOTE — Anesthesia Preprocedure Evaluation (Signed)
Anesthesia Evaluation  Patient identified by MRN, date of birth, ID band Patient awake    Reviewed: Allergy & Precautions, H&P , NPO status , Patient's Chart, lab work & pertinent test results, reviewed documented beta blocker date and time   History of Anesthesia Complications Negative for: history of anesthetic complications  Airway Mallampati: II  TM Distance: >3 FB Neck ROM: full    Dental  (+) Teeth Intact   Pulmonary neg pulmonary ROS,           Cardiovascular Exercise Tolerance: Good negative cardio ROS       Neuro/Psych negative neurological ROS  negative psych ROS   GI/Hepatic Neg liver ROS, GERD  ,  Endo/Other  negative endocrine ROS  Renal/GU negative Renal ROS  negative genitourinary   Musculoskeletal   Abdominal   Peds  Hematology negative hematology ROS (+)   Anesthesia Other Findings Past Medical History: 10/05/2016: Chronic reflux esophagitis 10/05/2016: Gastritis     Comment: Mild to moderate chronic gastritis, EGD Jan               2018, [place] date: GERD (gastroesophageal reflux disease)   Reproductive/Obstetrics negative OB ROS                             Anesthesia Physical Anesthesia Plan  ASA: II  Anesthesia Plan: General   Post-op Pain Management:    Induction:   Airway Management Planned:   Additional Equipment:   Intra-op Plan:   Post-operative Plan:   Informed Consent: I have reviewed the patients History and Physical, chart, labs and discussed the procedure including the risks, benefits and alternatives for the proposed anesthesia with the patient or authorized representative who has indicated his/her understanding and acceptance.   Dental Advisory Given  Plan Discussed with: Anesthesiologist, CRNA and Surgeon  Anesthesia Plan Comments:         Anesthesia Quick Evaluation

## 2016-11-07 NOTE — Op Note (Signed)
[facility] Gastroenterology Patient Name: Dustin Brady Procedure Date: 11/07/2016 2:05 PM MRN: [id number] Account #: [id number] Date of Birth: 10/17/1970 Admit Type: Outpatient Age: 46 Room: [facility] ENDO ROOM 3 Gender: Male Note Status: Finalized Procedure:            Upper GI endoscopy Indications:          Follow-up of gastritis Providers:            Christena Deem, MD Medicines:            Monitored Anesthesia Care Complications:        No immediate complications. Procedure:            Pre-Anesthesia Assessment:                       - ASA Grade Assessment: II - A patient with mild                        systemic disease.                       After obtaining informed consent, the endoscope was                        passed under direct vision. Throughout the procedure,                        the patient's blood pressure, pulse, and oxygen                        saturations were monitored continuously. The Endoscope                        was introduced through the mouth, and advanced to the                        fourth part of duodenum. The patient tolerated the                        procedure well. Findings:      The Z-line was variable. Biopsies were taken with a cold forceps for       histology.      The entire examined stomach was normal. Biopsies were taken with a cold       forceps for histology. Biopsies were taken with a cold forceps for       Helicobacter pylori testing.      The cardia and gastric fundus were normal on retroflexion.      The examined duodenum was normal. Impression:           - Z-line variable. Biopsied.                       - Normal stomach. Biopsied.                       - Normal examined duodenum. Recommendation:       - Use Protonix (pantoprazole) 40 mg PO daily daily. Procedure Code(s):    --- Professional ---                       [phone], Esophagogastroduodenoscopy, flexible, transoral;  with  biopsy, single or multiple Diagnosis Code(s):    --- Professional ---                       K22.8, Other specified diseases of esophagus                       K29.70, Gastritis, unspecified, without bleeding CPT copyright 2016 American Medical Association. All rights reserved. The codes documented in this report are preliminary and upon coder review may  be revised to meet current compliance requirements. Christena DeemMartin U , MD 11/07/2016 2:20:59 PM This report has been signed electronically. Number of Addenda: 0 Note Initiated On: 11/07/2016 2:05 PM      [facility]

## 2016-11-08 ENCOUNTER — Encounter: Payer: Self-pay | Admitting: Gastroenterology

## 2016-11-08 NOTE — Anesthesia Postprocedure Evaluation (Signed)
Anesthesia Post Note  Patient: Shea EvansJulius Behanna  Procedure(s) Performed: Procedure(s) (LRB): ESOPHAGOGASTRODUODENOSCOPY (EGD) WITH PROPOFOL (N/A)  Patient location during evaluation: PACU Anesthesia Type: General Level of consciousness: awake and alert Pain management: pain level controlled Vital Signs Assessment: post-procedure vital signs reviewed and stable Respiratory status: spontaneous breathing, nonlabored ventilation, respiratory function stable and patient connected to nasal cannula oxygen Cardiovascular status: blood pressure returned to baseline and stable Postop Assessment: no signs of nausea or vomiting Anesthetic complications: no     Last Vitals:  Vitals:   11/07/16 1444 11/07/16 1454  BP: 107/77 111/74  Pulse: 67 62  Resp: 16 18  Temp:      Last Pain:  Vitals:   11/08/16 0745  TempSrc:   PainSc: 0-No pain                 Yevette EdwardsJames G 

## 2016-11-09 LAB — SURGICAL PATHOLOGY

## 2017-10-20 ENCOUNTER — Encounter: Payer: Self-pay | Admitting: Family Medicine

## 2017-10-20 ENCOUNTER — Ambulatory Visit: Payer: BLUE CROSS/BLUE SHIELD | Admitting: Family Medicine

## 2017-10-20 VITALS — BP 100/70 | HR 90 | Resp 14 | Ht 67.0 in | Wt 217.8 lb

## 2017-10-20 DIAGNOSIS — F5102 Adjustment insomnia: Secondary | ICD-10-CM | POA: Diagnosis not present

## 2017-10-20 DIAGNOSIS — L918 Other hypertrophic disorders of the skin: Secondary | ICD-10-CM | POA: Diagnosis not present

## 2017-10-20 DIAGNOSIS — Z23 Encounter for immunization: Secondary | ICD-10-CM

## 2017-10-20 MED ORDER — TEMAZEPAM 15 MG PO CAPS: 15.0000 mg | ORAL_CAPSULE | Freq: Every evening | ORAL | 0 refills | Status: DC | PRN

## 2017-10-20 NOTE — Progress Notes (Signed)
Name: Dustin Brady   MRN: [id number]    DOB: 04/11/1971   Date:10/20/2017       Progress Note  Subjective  Chief Complaint  Chief Complaint  Patient presents with  . skin tag    removal of skin tags on the back of his neck and back.    HPI  Skin tags: he has a few skin tags around neck that bothers him when at work. It gets caught on his collar shirts. No bleeding but some of the lesions are growing. No previous history of skin cancer, no change in color of lesions.   Insomnia: he lost his mother and father within the past 4 months, still grieving and it is affecting his ability to fall asleep. He is getting at most 5 hours of sleep during the week, trying to sleep longer on the weekends.   Patient Active Problem List   Diagnosis Date Noted  . Chronic reflux esophagitis 10/05/2016  . Gastritis 10/05/2016  . Abdominal mass, left lower quadrant 04/27/2016  . Acid reflux 04/27/2016  . Night sweats 04/27/2016    Past Surgical History:  Procedure Laterality Date  . COLONOSCOPY    . ESOPHAGOGASTRODUODENOSCOPY    . ESOPHAGOGASTRODUODENOSCOPY (EGD) WITH PROPOFOL N/A 11/07/2016   Procedure: ESOPHAGOGASTRODUODENOSCOPY (EGD) WITH PROPOFOL;  Surgeon: Christena Deem, MD;  Location: [facility] ENDOSCOPY;  Service: Endoscopy;  Laterality: N/A;    Family History  Problem Relation Age of Onset  . Hypertension Father   . Diabetes Father   . Stroke Father   . Cancer Other     Social History   Socioeconomic History  . Marital status: Married    Spouse name: Not on file  . Number of children: 3  . Years of education: Not on file  . Highest education level: 12th grade  Social Needs  . Financial resource strain: Not on file  . Food insecurity - worry: Not on file  . Food insecurity - inability: Not on file  . Transportation needs - medical: Not on file  . Transportation needs - non-medical: Not on file  Occupational History  . Occupation: driver - trucks     Comment: [employer]  Tobacco Use   . Smoking status: Never Smoker  . Smokeless tobacco: Never Used  Substance and Sexual Activity  . Alcohol use: No  . Drug use: No  . Sexual activity: Yes    Partners: Female    Comment: wife had hysterectomy   Other Topics Concern  . Not on file  Social History Narrative  . Not on file     Current Outpatient Medications:  Marland Kitchen  Multiple Vitamin (MULTIVITAMIN WITH MINERALS) TABS, Take 1 tablet by mouth daily., Disp: , Rfl:  .  pantoprazole (PROTONIX) 40 MG tablet, Take 1 tablet (40 mg total) by mouth daily., Disp: 90 tablet, Rfl: 0  No Known Allergies   ROS  Ten systems reviewed and is negative except as mentioned in HPI   Objective  Vitals:   10/20/17 1045  BP: 100/70  Pulse: 90  Resp: 14  SpO2: 97%  Weight: 217 lb 12.8 oz (98.8 kg)  Height: 5\' 7"  (1.702 m)    Body mass index is 34.11 kg/m.  Physical Exam Constitutional: Patient appears well-developed and well-nourished. Obese  No distress.  HEENT: head atraumatic, normocephalic, pupils equal and reactive to light,  neck supple, throat within normal limits Cardiovascular: Normal rate, regular rhythm and normal heart sounds.  No murmur heard. No BLE edema. Pulmonary/Chest: Effort normal  and breath sounds normal. No respiratory distress. Abdominal: Soft.  There is no tenderness. Psychiatric: Patient has a normal mood and affect. behavior is normal. Judgment and thought content normal. Skin: skin tags, on back and multiple on neck   PHQ2/9: Depression screen [facility] 2/9 10/20/2017 04/27/2016  Decreased Interest 0 0  Down, Depressed, Hopeless 0 0  PHQ - 2 Score 0 0  Altered sleeping 1 -  Tired, decreased energy 0 -  Change in appetite 0 -  Feeling bad or failure about yourself  0 -  Trouble concentrating 0 -  Moving slowly or fidgety/restless 0 -  Suicidal thoughts 0 -  PHQ-9 Score 1 -  Difficult doing work/chores Not difficult at all -     Fall Risk: Fall Risk  10/20/2017 04/27/2016  Falls in the past year? No  No     Functional Status Survey: Is the patient deaf or have difficulty hearing?: No Does the patient have difficulty seeing, even when wearing glasses/contacts?: No Does the patient have difficulty concentrating, remembering, or making decisions?: No Does the patient have difficulty walking or climbing stairs?: No Does the patient have difficulty dressing or bathing?: No Does the patient have difficulty doing errands alone such as visiting a doctor's office or shopping?: No   Assessment & Plan  1. Skin tag  Consent signed: YES  Procedure: Skin Mass Removal Location: neck and mid back  Equipment used: derma-blade, high temperature cautery, sterile scalpel, tweezers, curved scissors Anesthesia: 1% Lidocaine w/o Epinephrine  Cleaned and prepped: Betadine  After consent signed, are of skin prepped with betadine. Lidocaine w/o epinephrine injected into skin underneath skin mass. After properly numbed sterile equipment used to remove tag.  Specimen sent for pathology analysis. Instructed on proper care to allow for proper healing. F/U for nursing visit if needed.  2. Flu vaccine need  Refused   3. Adjustment insomnia  He is already getting hospice counseling  - temazepam (RESTORIL) 15 MG capsule; Take 1 capsule (15 mg total) by mouth at bedtime as needed for sleep.  Dispense: 30 capsule; Refill: 0   4. Need for diphtheria-tetanus-pertussis (Tdap) vaccine  - Tdap vaccine greater than or equal to 7yo IM

## 2019-11-04 ENCOUNTER — Ambulatory Visit: Payer: Self-pay | Attending: Internal Medicine

## 2019-11-04 DIAGNOSIS — Z23 Encounter for immunization: Secondary | ICD-10-CM

## 2019-11-04 NOTE — Progress Notes (Signed)
   Covid-19 Vaccination Clinic  Name:  Dustin Brady    MRN: [id number] DOB: [birth year]  11/04/2019  [name] was observed post Covid-19 immunization for 15 minutes without incident. He was provided with Vaccine Information Sheet and instruction to access the V-Safe system.   [name] was instructed to call 911 with any severe reactions post vaccine: Marland Kitchen Difficulty breathing  . Swelling of face and throat  . A fast heartbeat  . A bad rash all over body  . Dizziness and weakness   Immunizations Administered    Name Date Dose VIS Date Route   Pfizer COVID-19 Vaccine 11/04/2019 11:44 AM 0.3 mL 07/26/2019 Intramuscular   Manufacturer: [employer], [employer]   Lot: [id number]   NDC: [address]-7

## 2019-11-26 ENCOUNTER — Encounter: Payer: Self-pay | Admitting: Family Medicine

## 2019-11-26 ENCOUNTER — Other Ambulatory Visit: Payer: Self-pay

## 2019-11-26 ENCOUNTER — Ambulatory Visit (INDEPENDENT_AMBULATORY_CARE_PROVIDER_SITE_OTHER): Payer: BC Managed Care – PPO | Admitting: Family Medicine

## 2019-11-26 VITALS — BP 122/80 | HR 72 | Temp 97.2°F | Resp 18 | Ht 67.0 in | Wt 217.2 lb

## 2019-11-26 DIAGNOSIS — M25471 Effusion, right ankle: Secondary | ICD-10-CM

## 2019-11-26 DIAGNOSIS — M7021 Olecranon bursitis, right elbow: Secondary | ICD-10-CM

## 2019-11-26 DIAGNOSIS — Z130 Encounter for screening for diseases of the blood and blood-forming organs and certain disorders involving the immune mechanism: Secondary | ICD-10-CM

## 2019-11-26 DIAGNOSIS — Z114 Encounter for screening for human immunodeficiency virus [HIV]: Secondary | ICD-10-CM

## 2019-11-26 DIAGNOSIS — Z0000 Encounter for general adult medical examination without abnormal findings: Secondary | ICD-10-CM | POA: Diagnosis not present

## 2019-11-26 DIAGNOSIS — Z131 Encounter for screening for diabetes mellitus: Secondary | ICD-10-CM | POA: Diagnosis not present

## 2019-11-26 DIAGNOSIS — Z13220 Encounter for screening for lipoid disorders: Secondary | ICD-10-CM | POA: Diagnosis not present

## 2019-11-26 DIAGNOSIS — Z1159 Encounter for screening for other viral diseases: Secondary | ICD-10-CM

## 2019-11-26 DIAGNOSIS — N529 Male erectile dysfunction, unspecified: Secondary | ICD-10-CM

## 2019-11-26 DIAGNOSIS — Z8719 Personal history of other diseases of the digestive system: Secondary | ICD-10-CM | POA: Diagnosis not present

## 2019-11-26 DIAGNOSIS — Z1211 Encounter for screening for malignant neoplasm of colon: Secondary | ICD-10-CM

## 2019-11-26 MED ORDER — VITAMIN D 50 MCG (2000 UT) PO CAPS: 1.0000 | ORAL_CAPSULE | Freq: Every day | ORAL | 0 refills | Status: DC

## 2019-11-26 MED ORDER — TADALAFIL 20 MG PO TABS: 10.0000 mg | ORAL_TABLET | ORAL | 0 refills | Status: DC | PRN

## 2019-11-26 NOTE — Progress Notes (Signed)
Name: Dustin Brady   MRN: [id number]    DOB: 1971-02-03   Date:11/26/2019       Progress Note  Subjective  Chief Complaint  Chief Complaint  Patient presents with  . Annual Exam    HPI  Patient presents for annual CPE and discuss a few things  Ankle pain/Foot pain: going on for years, he notices intermittent pain, swelling, redness and increase in warmth. He states seems to happen after eating shellfish and travelling . Episodes about once a month, improves with rest, tylenol prn   Bursitis right elbow: he has history of injury on right elbow. He sates has swelling and tenderness intermittently but at least once a month that lasts two days on average.   ED: wife has noticed not lasting and not as hard as usual. He denies taking testosterone supplementation, we will try cialis prn for now  USPSTF grade A and B recommendations:  Diet: balanced , cooks at home  Exercise:   Depression: phq 9 is negative Depression screen [facility] 2/9 11/26/2019 10/20/2017 04/27/2016  Decreased Interest 0 0 0  Down, Depressed, Hopeless 0 0 0  PHQ - 2 Score 0 0 0  Altered sleeping 0 1 -  Tired, decreased energy 0 0 -  Change in appetite 0 0 -  Feeling bad or failure about yourself  0 0 -  Trouble concentrating 0 0 -  Moving slowly or fidgety/restless 0 0 -  Suicidal thoughts 0 0 -  PHQ-9 Score 0 1 -  Difficult doing work/chores Not difficult at all Not difficult at all -    Hypertension:  BP Readings from Last 3 Encounters:  11/26/19 122/80  10/20/17 100/70  11/07/16 111/74    Obesity: Wt Readings from Last 3 Encounters:  11/26/19 217 lb 3.2 oz (98.5 kg)  10/20/17 217 lb 12.8 oz (98.8 kg)  11/07/16 195 lb (88.5 kg)   BMI Readings from Last 3 Encounters:  11/26/19 34.02 kg/m  10/20/17 34.11 kg/m  11/07/16 30.54 kg/m     Lipids:  No results found for: CHOL No results found for: HDL No results found for: LDLCALC No results found for: TRIG No results found for: CHOLHDL No results  found for: LDLDIRECT Glucose:  Glucose, Bld  Date Value Ref Range Status  04/27/2016 88 65 - 99 mg/dL Final      [facility] Visit from 11/26/2019 in [facility]  AUDIT-C Score  0       Married STD testing and prevention (HIV/chl/gon/syphilis): not interested we will just check HIV  Hep C: today   Skin cancer: Discussed monitoring for atypical lesions Colorectal cancer: 08/24/2016 Dr. Marva Panda repeat in 10 years Prostate cancer:   IPSS Questionnaire (AUA-7): Over the past month.   1)  How often have you had a sensation of not emptying your bladder completely after you finish urinating?  0 - Not at all  2)  How often have you had to urinate again less than two hours after you finished urinating? 1 - Less than 1 time in 5  3)  How often have you found you stopped and started again several times when you urinated?  0 - Not at all  4) How difficult have you found it to postpone urination?  0 - Not at all  5) How often have you had a weak urinary stream?  0 - Not at all  6) How often have you had to push or strain to begin urination?  0 - Not  at all  7) How many times did you most typically get up to urinate from the time you went to bed until the time you got up in the morning?  0 - None  Total score:  0-7 mildly symptomatic   8-19 moderately symptomatic   20-35 severely symptomatic    Lung cancer:   Low Dose CT Chest recommended if Age 60-80 years, 30 pack-year currently smoking OR have quit w/in 15years. Patient does not qualify.   AAA:  The USPSTF recommends one-time screening with ultrasonography in men ages 58 to 74 years who have ever smoked   Advanced Care Planning: A voluntary discussion about advance care planning including the explanation and discussion of advance directives.  Discussed health care proxy and Living will, and the patient was able to identify a health care proxy as wife .  Patient does not have a living will at present time. If patient does  have living will, I have requested they bring this to the clinic to be scanned in to their chart.  Patient Active Problem List   Diagnosis Date Noted  . Chronic reflux esophagitis 10/05/2016  . Gastritis 10/05/2016  . Night sweats 04/27/2016    Past Surgical History:  Procedure Laterality Date  . COLONOSCOPY    . ESOPHAGOGASTRODUODENOSCOPY    . ESOPHAGOGASTRODUODENOSCOPY (EGD) WITH PROPOFOL N/A 11/07/2016   Procedure: ESOPHAGOGASTRODUODENOSCOPY (EGD) WITH PROPOFOL;  Surgeon: Christena Deem, MD;  Location[facility] ENDOSCOPY;  Service: Endoscopy;  Laterality: N/A;    Family History  Problem Relation Age of Onset  . Hypertension Father   . Diabetes Father   . Stroke Father   . Cancer Other     Social History   Socioeconomic History  . Marital status: Married    Spouse name: Not on file  . Number of children: 3  . Years of education: Not on file  . Highest education level: 12th grade  Occupational History  . Occupation: driver - trucks     Comment: [employer]  Tobacco Use  . Smoking status: Never Smoker  . Smokeless tobacco: Never Used  Substance and Sexual Activity  . Alcohol use: No  . Drug use: No  . Sexual activity: Yes    Partners: Female    Comment: wife had hysterectomy   Other Topics Concern  . Not on file  Social History Narrative  . Not on file   Social Determinants of Health   Financial Resource Strain: Low Risk   . Difficulty of Paying Living Expenses: Not hard at all  Food Insecurity: No Food Insecurity  . Worried About Programme researcher, broadcasting/film/video in the Last Year: Never true  . Ran Out of Food in the Last Year: Never true  Transportation Needs: No Transportation Needs  . Lack of Transportation (Medical): No  . Lack of Transportation (Non-Medical): No  Physical Activity: Sufficiently Active  . Days of Exercise per Week: 6 days  . Minutes of Exercise per Session: 70 min  Stress: No Stress Concern Present  . Feeling of Stress : Only a little  Social  Connections: Not Isolated  . Frequency of Communication with Friends and Family: More than three times a week  . Frequency of Social Gatherings with Friends and Family: Twice a week  . Attends Religious Services: More than 4 times per year  . Active Member of Clubs or Organizations: Yes  . Attends Banker Meetings: More than 4 times per year  . Marital Status: Married  Catering manager  Violence: Not At Risk  . Fear of Current or Ex-Partner: No  . Emotionally Abused: No  . Physically Abused: No  . Sexually Abused: No     Current Outpatient Medications:  .  Cholecalciferol (VITAMIN D) 50 MCG (2000 UT) CAPS, Take 1 capsule (2,000 Units total) by mouth daily., Disp: 30 capsule, Rfl: 0 .  pantoprazole (PROTONIX) 40 MG tablet, Take 1 tablet (40 mg total) by mouth daily. (Patient not taking: Reported on 11/26/2019), Disp: 90 tablet, Rfl: 0  No Known Allergies   ROS  Constitutional: Negative for fever or weight change.  Respiratory: Negative for cough and shortness of breath.   Cardiovascular: Negative for chest pain or palpitations.  Gastrointestinal: Negative for abdominal pain, no bowel changes.  Musculoskeletal: Negative for gait problem or joint swelling.  Skin: Negative for rash.  Neurological: Negative for dizziness or headache.  No other specific complaints in a complete review of systems (except as listed in HPI above).  Objective  Vitals:   11/26/19 0817  BP: 122/80  Pulse: 72  Resp: 18  Temp: (!) 97.2 F (36.2 C)  TempSrc: Temporal  SpO2: 100%  Weight: 217 lb 3.2 oz (98.5 kg)  Height: 5\' 7"  (1.702 m)    Body mass index is 34.02 kg/m.  Physical Exam  Constitutional: Patient appears well-developed and well-nourished. No distress.  HENT: Head: Normocephalic and atraumatic. Ears: B TMs ok, no erythema or effusion; Nose: not done Mouth/Throat: not done   Eyes: Conjunctivae and EOM are normal. Pupils are equal, round, and reactive to light. No scleral  icterus.  Neck: Normal range of motion. Neck supple. No JVD present. No thyromegaly present.  Cardiovascular: Normal rate, regular rhythm and normal heart sounds.  No murmur heard. No BLE edema. Pulmonary/Chest: Effort normal and breath sounds normal. No respiratory distress. Abdominal: Soft. Bowel sounds are normal, no distension. There is no tenderness. no masses MALE GENITALIA: Normal descended testes bilaterally, no masses palpated, no hernias, no lesions, no discharge RECTAL: not done Musculoskeletal: Normal range of motion, no joint effusions. No gross deformities Neurological: he is alert and oriented to person, place, and time. No cranial nerve deficit. Coordination, balance, strength, speech and gait are normal.  Skin: Skin is warm and dry. No rash noted. No erythema.  Psychiatric: Patient has a normal mood and affect. behavior is normal. Judgment and thought content normal.   PHQ2/9: Depression screen [facility] 2/9 11/26/2019 10/20/2017 04/27/2016  Decreased Interest 0 0 0  Down, Depressed, Hopeless 0 0 0  PHQ - 2 Score 0 0 0  Altered sleeping 0 1 -  Tired, decreased energy 0 0 -  Change in appetite 0 0 -  Feeling bad or failure about yourself  0 0 -  Trouble concentrating 0 0 -  Moving slowly or fidgety/restless 0 0 -  Suicidal thoughts 0 0 -  PHQ-9 Score 0 1 -  Difficult doing work/chores Not difficult at all Not difficult at all -     Fall Risk: Fall Risk  11/26/2019 10/20/2017 04/27/2016  Falls in the past year? 0 No No  Number falls in past yr: 0 - -  Injury with Fall? 0 - -  Follow up Falls evaluation completed - -    Assessment & Plan  1. Well adult exam  - COMPLETE METABOLIC PANEL WITH GFR  2. Diabetes mellitus screening  - Hemoglobin A1c  3. Lipid screening  - Lipid panel  4. History of gastritis  - CBC with Differential/Platelet  5.  Screening for deficiency anemia  - CBC with Differential/Platelet  6. Colon cancer screening  Up to date  7.  Encounter for screening for HIV  - HIV Antibody (routine testing w rflx)  8. Need for hepatitis C screening test  - Hepatitis C antibody   -Prostate cancer screening and PSA options (with potential risks and benefits of testing vs not testing) were discussed along with recent recs/guidelines. -USPSTF grade A and B recommendations reviewed with patient; age-appropriate recommendations, preventive care, screening tests, etc discussed and encouraged; healthy living encouraged; see AVS for patient education given to patient -Discussed importance of 150 minutes of physical activity weekly, eat two servings of fish weekly, eat one serving of tree nuts ( cashews, pistachios, pecans, almonds.Marland Kitchen) every other day, eat 6 servings of fruit/vegetables daily and drink plenty of water and avoid sweet beverages.

## 2019-11-26 NOTE — Patient Instructions (Addendum)
Preventive Care 41-47 Years Old, Male Preventive care refers to lifestyle choices and visits with your health care provider that can promote health and wellness. This includes:  A yearly physical exam. This is also called an annual well check.  Regular dental and eye exams.  Immunizations.  Screening for certain conditions.  Healthy lifestyle choices, such as eating a healthy diet, getting regular exercise, not using drugs or products that contain nicotine and tobacco, and limiting alcohol use. What can I expect for my preventive care visit? Physical exam Your health care provider will check:  Height and weight. These may be used to calculate body mass index (BMI), which is a measurement that tells if you are at a healthy weight.  Heart rate and blood pressure.  Your skin for abnormal spots. Counseling Your health care provider may ask you questions about:  Alcohol, tobacco, and drug use.  Emotional well-being.  Home and relationship well-being.  Sexual activity.  Eating habits.  Work and work Statistician. What immunizations do I need?  Influenza (flu) vaccine  This is recommended every year. Tetanus, diphtheria, and pertussis (Tdap) vaccine  You may need a Td booster every 10 years. Varicella (chickenpox) vaccine  You may need this vaccine if you have not already been vaccinated. Zoster (shingles) vaccine  You may need this after age 64. Measles, mumps, and rubella (MMR) vaccine  You may need at least one dose of MMR if you were born in 1957 or later. You may also need a second dose. Pneumococcal conjugate (PCV13) vaccine  You may need this if you have certain conditions and were not previously vaccinated. Pneumococcal polysaccharide (PPSV23) vaccine  You may need one or two doses if you smoke cigarettes or if you have certain conditions. Meningococcal conjugate (MenACWY) vaccine  You may need this if you have certain conditions. Hepatitis A  vaccine  You may need this if you have certain conditions or if you travel or work in places where you may be exposed to hepatitis A. Hepatitis B vaccine  You may need this if you have certain conditions or if you travel or work in places where you may be exposed to hepatitis B. Haemophilus influenzae type b (Hib) vaccine  You may need this if you have certain risk factors. Human papillomavirus (HPV) vaccine  If recommended by your health care provider, you may need three doses over 6 months. You may receive vaccines as individual doses or as more than one vaccine together in one shot (combination vaccines). Talk with your health care provider about the risks and benefits of combination vaccines. What tests do I need? Blood tests  Lipid and cholesterol levels. These may be checked every 5 years, or more frequently if you are over 60 years old.  Hepatitis C test.  Hepatitis B test. Screening  Lung cancer screening. You may have this screening every year starting at age 43 if you have a 30-pack-year history of smoking and currently smoke or have quit within the past 15 years.  Prostate cancer screening. Recommendations will vary depending on your family history and other risks.  Colorectal cancer screening. All adults should have this screening starting at age 72 and continuing until age 2. Your health care provider may recommend screening at age 14 if you are at increased risk. You will have tests every 1-10 years, depending on your results and the type of screening test.  Diabetes screening. This is done by checking your blood sugar (glucose) after you have not eaten  for a while (fasting). You may have this done every 1-3 years.  Sexually transmitted disease (STD) testing. Follow these instructions at home: Eating and drinking  Eat a diet that includes fresh fruits and vegetables, whole grains, lean protein, and low-fat dairy products.  Take vitamin and mineral supplements as  recommended by your health care provider.  Do not drink alcohol if your health care provider tells you not to drink.  If you drink alcohol: ? Limit how much you have to 0-2 drinks a day. ? Be aware of how much alcohol is in your drink. In the [place] one drink equals one 12 oz bottle of beer (355 mL), one 5 oz glass of wine (148 mL), or one 1 oz glass of hard liquor (44 mL). Lifestyle  Take daily care of your teeth and gums.  Stay active. Exercise for at least 30 minutes on 5 or more days each week.  Do not use any products that contain nicotine or tobacco, such as cigarettes, e-cigarettes, and chewing tobacco. If you need help quitting, ask your health care provider.  If you are sexually active, practice safe sex. Use a condom or other form of protection to prevent STIs (sexually transmitted infections).  Talk with your health care provider about taking a low-dose aspirin every day starting at age 8. What's next?  Go to your health care provider once a year for a well check visit.  Ask your health care provider how often you should have your eyes and teeth checked.  Stay up to date on all vaccines. This information is not intended to replace advice given to you by your health care provider. Make sure you discuss any questions you have with your health care provider. Document Revised: 07/26/2018 Document Reviewed: 07/26/2018 Elsevier Patient Education  [place].  Gout  Gout is a condition that causes painful swelling of the joints. Gout is a type of inflammation of the joints (arthritis). This condition is caused by having too much uric acid in the body. Uric acid is a chemical that forms when the body breaks down substances called purines. Purines are important for building body proteins. When the body has too much uric acid, sharp crystals can form and build up inside the joints. This causes pain and swelling. Gout attacks can happen quickly and may be very painful (acute  gout). Over time, the attacks can affect more joints and become more frequent (chronic gout). Gout can also cause uric acid to build up under the skin and inside the kidneys. What are the causes? This condition is caused by too much uric acid in your blood. This can happen because:  Your kidneys do not remove enough uric acid from your blood. This is the most common cause.  Your body makes too much uric acid. This can happen with some cancers and cancer treatments. It can also occur if your body is breaking down too many red blood cells (hemolytic anemia).  You eat too many foods that are high in purines. These foods include organ meats and some seafood. Alcohol, especially beer, is also high in purines. A gout attack may be triggered by trauma or stress. What increases the risk? You are more likely to develop this condition if you:  Have a family history of gout.  Are male and middle-aged.  Are male and have gone through menopause.  Are obese.  Frequently drink alcohol, especially beer.  Are dehydrated.  Lose weight too quickly.  Have an organ transplant.  Have lead poisoning.  Take certain medicines, including aspirin, cyclosporine, diuretics, levodopa, and niacin.  Have kidney disease.  Have a skin condition called psoriasis. What are the signs or symptoms? An attack of acute gout happens quickly. It usually occurs in just one joint. The most common place is the big toe. Attacks often start at night. Other joints that may be affected include joints of the feet, ankle, knee, fingers, wrist, or elbow. Symptoms of this condition may include:  Severe pain.  Warmth.  Swelling.  Stiffness.  Tenderness. The affected joint may be very painful to touch.  Shiny, red, or purple skin.  Chills and fever. Chronic gout may cause symptoms more frequently. More joints may be involved. You may also have white or yellow lumps (tophi) on your hands or feet or in other areas near  your joints. How is this diagnosed? This condition is diagnosed based on your symptoms, medical history, and physical exam. You may have tests, such as:  Blood tests to measure uric acid levels.  Removal of joint fluid with a thin needle (aspiration) to look for uric acid crystals.  X-rays to look for joint damage. How is this treated? Treatment for this condition has two phases: treating an acute attack and preventing future attacks. Acute gout treatment may include medicines to reduce pain and swelling, including:  NSAIDs.  Steroids. These are strong anti-inflammatory medicines that can be taken by mouth (orally) or injected into a joint.  Colchicine. This medicine relieves pain and swelling when it is taken soon after an attack. It can be given by mouth or through an IV. Preventive treatment may include:  Daily use of smaller doses of NSAIDs or colchicine.  Use of a medicine that reduces uric acid levels in your blood.  Changes to your diet. You may need to see a dietitian about what to eat and drink to prevent gout. Follow these instructions at home: During a gout attack   If directed, put ice on the affected area: ? Put ice in a plastic bag. ? Place a towel between your skin and the bag. ? Leave the ice on for 20 minutes, 2-3 times a day.  Raise (elevate) the affected joint above the level of your heart as often as possible.  Rest the joint as much as possible. If the affected joint is in your leg, you may be given crutches to use.  Follow instructions from your health care provider about eating or drinking restrictions. Avoiding future gout attacks  Follow a low-purine diet as told by your dietitian or health care provider. Avoid foods and drinks that are high in purines, including liver, kidney, anchovies, asparagus, herring, mushrooms, mussels, and beer.  Maintain a healthy weight or lose weight if you are overweight. If you want to lose weight, talk with your health  care provider. It is important that you do not lose weight too quickly.  Start or maintain an exercise program as told by your health care provider. Eating and drinking  Drink enough fluids to keep your urine pale yellow.  If you drink alcohol: ? Limit how much you use to:  0-1 drink a day for women.  0-2 drinks a day for men. ? Be aware of how much alcohol is in your drink. In the [place] one drink equals one 12 oz bottle of beer (355 mL) one 5 oz glass of wine (148 mL), or one 1 oz glass of hard liquor (44 mL). General instructions  Take over-the-counter and  prescription medicines only as told by your health care provider.  Do not drive or use heavy machinery while taking prescription pain medicine.  Return to your normal activities as told by your health care provider. Ask your health care provider what activities are safe for you.  Keep all follow-up visits as told by your health care provider. This is important. Contact a health care provider if you have:  Another gout attack.  Continuing symptoms of a gout attack after 10 days of treatment.  Side effects from your medicines.  Chills or a fever.  Burning pain when you urinate.  Pain in your lower back or belly. Get help right away if you:  Have severe or uncontrolled pain.  Cannot urinate. Summary  Gout is painful swelling of the joints caused by inflammation.  The most common site of pain is the big toe, but it can affect other joints in the body.  Medicines and dietary changes can help to prevent and treat gout attacks. This information is not intended to replace advice given to you by your health care provider. Make sure you discuss any questions you have with your health care provider. Document Revised: 02/21/2018 Document Reviewed: 02/21/2018 Elsevier Patient Education  [place] A low-purine eating plan involves making food choices to limit your intake of purine. Purine  is a kind of uric acid. Too much uric acid in your blood can cause certain conditions, such as gout and kidney stones. Eating a low-purine diet can help control these conditions. What are tips for following this plan? Reading food labels   Avoid foods with saturated or Trans fat.  Check the ingredient list of grains-based foods, such as bread and cereal, to make sure that they contain whole grains.  Check the ingredient list of sauces or soups to make sure they do not contain meat or fish.  When choosing soft drinks, check the ingredient list to make sure they do not contain high-fructose corn syrup. Shopping  Buy plenty of fresh fruits and vegetables.  Avoid buying canned or fresh fish.  Buy dairy products labeled as low-fat or nonfat.  Avoid buying premade or processed foods. These foods are often high in fat, salt (sodium), and added sugar. Cooking  Use olive oil instead of butter when cooking. Oils like olive oil, canola oil, and sunflower oil contain healthy fats. Meal planning  Learn which foods do or do not affect you. If you find out that a food tends to cause your gout symptoms to flare up, avoid eating that food. You can enjoy foods that do not cause problems. If you have any questions about a food item, talk with your dietitian or health care provider.  Limit foods high in fat, especially saturated fat. Fat makes it harder for your body to get rid of uric acid.  Choose foods that are lower in fat and are lean sources of protein. General guidelines  Limit alcohol intake to no more than 1 drink a day for nonpregnant women and 2 drinks a day for men. One drink equals 12 oz of beer, 5 oz of wine, or 1 oz of hard liquor. Alcohol can affect the way your body gets rid of uric acid.  Drink plenty of water to keep your urine clear or pale yellow. Fluids can help remove uric acid from your body.  If directed by your health care provider, take a vitamin C supplement.  Work  with your health care provider and  dietitian to develop a plan to achieve or maintain a healthy weight. Losing weight can help reduce uric acid in your blood. What foods are recommended? The items listed may not be a complete list. Talk with your dietitian about what dietary choices are best for you. Foods low in purines Foods low in purines do not need to be limited. These include:  All fruits.  All low-purine vegetables, pickles, and olives.  Breads, pasta, rice, cornbread, and popcorn. Cake and other baked goods.  All dairy foods.  Eggs, nuts, and nut butters.  Spices and condiments, such as salt, herbs, and vinegar.  Plant oils, butter, and margarine.  Water, sugar-free soft drinks, tea, coffee, and cocoa.  Vegetable-based soups, broths, sauces, and gravies. Foods moderate in purines Foods moderate in purines should be limited to the amounts listed.   cup of asparagus, cauliflower, spinach, mushrooms, or green peas, each day.  2/3 cup uncooked oatmeal, each day.   cup dry wheat bran or wheat germ, each day.  2-3 ounces of meat or poultry, each day.  4-6 ounces of shellfish, such as crab, lobster, oysters, or shrimp, each day.  1 cup cooked beans, peas, or lentils, each day.  Soup, broths, or bouillon made from meat or fish. Limit these foods as much as possible. What foods are not recommended? The items listed may not be a complete list. Talk with your dietitian about what dietary choices are best for you. Limit your intake of foods high in purines, including:  Beer and other alcohol.  Meat-based gravy or sauce.  Canned or fresh fish, such as: ? Anchovies, sardines, herring, and tuna. ? Mussels and scallops. ? Codfish, trout, and haddock.  Berniece Salines.  Organ meats, such as: ? Liver or kidney. ? Tripe. ? Sweetbreads (thymus gland or pancreas).  [employer] biochemist.  Yeast or yeast extract supplements.  Drinks sweetened with high-fructose corn  syrup. Summary  Eating a low-purine diet can help control conditions caused by too much uric acid in the body, such as gout or kidney stones.  Choose low-purine foods, limit alcohol, and limit foods high in fat.  You will learn over time which foods do or do not affect you. If you find out that a food tends to cause your gout symptoms to flare up, avoid eating that food. This information is not intended to replace advice given to you by your health care provider. Make sure you discuss any questions you have with your health care provider. Document Revised: 07/14/2017 Document Reviewed: 09/14/2016 Elsevier Patient Education  2020 [employer].

## 2019-11-27 ENCOUNTER — Ambulatory Visit: Payer: Self-pay | Attending: Internal Medicine

## 2019-11-27 DIAGNOSIS — Z23 Encounter for immunization: Secondary | ICD-10-CM

## 2019-11-27 LAB — CBC WITH DIFFERENTIAL/PLATELET
Absolute Monocytes: 552 cells/uL (ref 200–950)
Basophils Absolute: 48 cells/uL (ref 0–200)
Basophils Relative: 0.7 %
Eosinophils Absolute: 221 cells/uL (ref 15–500)
Eosinophils Relative: 3.2 %
HCT: 43.5 % (ref 38.5–50.0)
Hemoglobin: 14.4 g/dL (ref 13.2–17.1)
Lymphs Abs: 3098 cells/uL (ref 850–3900)
MCH: 31.1 pg (ref 27.0–33.0)
MCHC: 33.1 g/dL (ref 32.0–36.0)
MCV: 94 fL (ref 80.0–100.0)
MPV: 11.8 fL (ref 7.5–12.5)
Monocytes Relative: 8 %
Neutro Abs: 2981 cells/uL (ref 1500–7800)
Neutrophils Relative %: 43.2 %
Platelets: 223 10*3/uL (ref 140–400)
RBC: 4.63 10*6/uL (ref 4.20–5.80)
RDW: 13.2 % (ref 11.0–15.0)
Total Lymphocyte: 44.9 %
WBC: 6.9 10*3/uL (ref 3.8–10.8)

## 2019-11-27 LAB — HEPATITIS C ANTIBODY
Hepatitis C Ab: NONREACTIVE
SIGNAL TO CUT-OFF: 0.02 (ref <1.00)

## 2019-11-27 LAB — HEMOGLOBIN A1C
Hgb A1c MFr Bld: 5.8 % of total Hgb — ABNORMAL HIGH (ref <5.7)
Mean Plasma Glucose: 120 (calc)
eAG (mmol/L): 6.6 (calc)

## 2019-11-27 LAB — LIPID PANEL
Cholesterol: 183 mg/dL (ref <200)
HDL: 33 mg/dL — ABNORMAL LOW (ref >=40)
LDL Cholesterol (Calc): 125 mg/dL (calc) — ABNORMAL HIGH
Non-HDL Cholesterol (Calc): 150 mg/dL (calc) — ABNORMAL HIGH (ref <130)
Total CHOL/HDL Ratio: 5.5 (calc) — ABNORMAL HIGH (ref <5.0)
Triglycerides: 132 mg/dL (ref <150)

## 2019-11-27 LAB — COMPLETE METABOLIC PANEL WITH GFR
AG Ratio: 1.5 (calc) (ref 1.0–2.5)
ALT: 21 U/L (ref 9–46)
AST: 21 U/L (ref 10–40)
Albumin: 4.4 g/dL (ref 3.6–5.1)
Alkaline phosphatase (APISO): 35 U/L — ABNORMAL LOW (ref 36–130)
BUN: 17 mg/dL (ref 7–25)
CO2: 29 mmol/L (ref 20–32)
Calcium: 9.6 mg/dL (ref 8.6–10.3)
Chloride: 103 mmol/L (ref 98–110)
Creat: 1.24 mg/dL (ref 0.60–1.35)
GFR, Est African American: 80 mL/min/{1.73_m2} (ref >=60)
GFR, Est Non African American: 69 mL/min/{1.73_m2} (ref >=60)
Globulin: 2.9 g/dL (calc) (ref 1.9–3.7)
Glucose, Bld: 93 mg/dL (ref 65–99)
Potassium: 4 mmol/L (ref 3.5–5.3)
Sodium: 139 mmol/L (ref 135–146)
Total Bilirubin: 0.3 mg/dL (ref 0.2–1.2)
Total Protein: 7.3 g/dL (ref 6.1–8.1)

## 2019-11-27 LAB — URIC ACID: Uric Acid, Serum: 8.1 mg/dL — ABNORMAL HIGH (ref 4.0–8.0)

## 2019-11-27 LAB — HIV ANTIBODY (ROUTINE TESTING W REFLEX): HIV 1&2 Ab, 4th Generation: NONREACTIVE

## 2019-11-27 NOTE — Progress Notes (Signed)
   [facility]  Name:  Doral Ventrella    MRN: [id number] DOB: July 29, 1971  11/27/2019  Mr. Manninen was observed post Covid-19 immunization for 15 minutes without incident. He was provided with Vaccine Information Sheet and instruction to access the V-Safe system.   Mr. Maciolek was instructed to call 911 with any severe reactions post vaccine: Marland Kitchen Difficulty breathing  . Swelling of face and throat  . A fast heartbeat  . A bad rash all over body  . Dizziness and weakness   Immunizations Administered    Name Date Dose VIS Date Route   Pfizer COVID-19 Vaccine 11/27/2019 11:31 AM 0.3 mL 07/26/2019 Intramuscular   Manufacturer: [employer], [employer]   Lot: [id number]   NDC: [address]-3

## 2019-12-01 ENCOUNTER — Other Ambulatory Visit: Payer: Self-pay | Admitting: Family Medicine

## 2019-12-01 MED ORDER — ALLOPURINOL 100 MG PO TABS: 100.0000 mg | ORAL_TABLET | Freq: Two times a day (BID) | ORAL | 5 refills | Status: DC

## 2019-12-03 ENCOUNTER — Ambulatory Visit: Payer: Self-pay

## 2020-01-07 ENCOUNTER — Other Ambulatory Visit: Payer: Self-pay | Admitting: Family Medicine

## 2020-01-07 DIAGNOSIS — N529 Male erectile dysfunction, unspecified: Secondary | ICD-10-CM

## 2020-01-07 MED ORDER — TADALAFIL 20 MG PO TABS: 10.0000 mg | ORAL_TABLET | ORAL | 0 refills | Status: DC | PRN

## 2020-03-10 ENCOUNTER — Ambulatory Visit (INDEPENDENT_AMBULATORY_CARE_PROVIDER_SITE_OTHER): Payer: BC Managed Care – PPO | Admitting: Internal Medicine

## 2020-03-10 ENCOUNTER — Encounter: Payer: Self-pay | Admitting: Internal Medicine

## 2020-03-10 VITALS — BP 120/86 | HR 89 | Temp 97.9°F | Resp 16 | Ht 68.0 in | Wt 230.7 lb

## 2020-03-10 DIAGNOSIS — M25571 Pain in right ankle and joints of right foot: Secondary | ICD-10-CM

## 2020-03-10 DIAGNOSIS — M109 Gout, unspecified: Secondary | ICD-10-CM | POA: Diagnosis not present

## 2020-03-10 DIAGNOSIS — M76821 Posterior tibial tendinitis, right leg: Secondary | ICD-10-CM | POA: Diagnosis not present

## 2020-03-10 NOTE — Progress Notes (Signed)
Patient ID: Dustin Brady, male    DOB: [birth year], 49 y.o.   MRN: [id number]  PCP: Alba Cory, MD  Chief Complaint  Patient presents with  . Joint Swelling    Started this past Friday 7/23 while at [facility], has gotten worse with time  . Ankle Pain    Pain is worse when he puts pressure on it    Subjective:   Dustin Brady is a 49 y.o. male, presents to clinic with CC of the following:  Chief Complaint  Patient presents with  . Joint Swelling    Started this past Friday 7/23 while at [facility], has gotten worse with time  . Ankle Pain    Pain is worse when he puts pressure on it    HPI:   Patient is a 49 year old male patient of Dr. Carlynn Purl Presents today with ankle pain.  He was at [facility], and Thursday was at a water park, with no trauma noted during that time.  When he awoke Friday, he had pain in his right ankle area, mainly when up and about and weightbearing.  It became more problematic as up and about, and started to swell some.  Yesterday, he noted it hurt to move his big toe, with pain felt at the base of the big toe and radiating back towards the arch of the foot.  He denied the toe being red, swollen, warm to the touch, or focally painful right at the joint at the base of the toe.  That has improved today.  He states they flew home from [place] yesterday, and the ankle pain persists.  He denies pain when just resting, no pain when pulling the covers up over his ankle overnight, but painful when he gets up and weightbears, and somewhat painful with pressure to the area.  He notes the ankle is still a little swollen. He did have a history of gout in his past, and with that, he noted his toe was more swollen and painful and red when that occurred. He denies increased alcohol use on his trip to [place], did have a couple beers at times, and was eating out in restaurants.  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  He states he has lessened use of that because it bothers his stomach, and he takes Protonix daily to help control his reflux symptoms. He has just tried Tylenol to help with the pain, does not take ibuprofen or Aleve products routinely due to his reflux history.  He thinks his symptoms may be a little better today.  He works for [employer], has been out on [employer] for other reasons, and was to return this week, although noted he can be out the rest of this week when discussed the importance of relative rest to help him with improvement.  No h/o major prior ankle injury   Patient Active Problem List   Diagnosis Date Noted  . Chronic reflux esophagitis 10/05/2016  . Gastritis 10/05/2016  . Night sweats 04/27/2016      Current Outpatient Medications:  .  allopurinol (ZYLOPRIM) 100 MG tablet, Take 1 tablet (100 mg total) by mouth 2 (two) times daily. (Patient taking differently: Take 100 mg by mouth 2 (two) times daily. Does not take all the time), Disp: 60 tablet, Rfl: 5 .  Cholecalciferol (VITAMIN D) 50 MCG (  2000 UT) CAPS, Take 1 capsule (2,000 Units total) by mouth daily. (Patient taking differently: Take 1 capsule by mouth daily. Every other day), Disp: 30 capsule, Rfl: 0 .  pantoprazole (PROTONIX) 40 MG tablet, Take 1 tablet (40 mg total) by mouth daily., Disp: 90 tablet, Rfl: 0 .  tadalafil (CIALIS) 20 MG tablet, Take 0.5-1 tablets (10-20 mg total) by mouth every other day as needed for erectile dysfunction., Disp: 30 tablet, Rfl: 0   No Known Allergies   Past Surgical History:  Procedure Laterality Date  . COLONOSCOPY    . ESOPHAGOGASTRODUODENOSCOPY    . ESOPHAGOGASTRODUODENOSCOPY (EGD) WITH PROPOFOL N/A 11/07/2016   Procedure: ESOPHAGOGASTRODUODENOSCOPY (EGD) WITH PROPOFOL;  Surgeon: Christena Deem, MD;  Location: [facility] ENDOSCOPY;  Service: Endoscopy;  Laterality: N/A;     Family History  Problem Relation Age of  Onset  . Hypertension Father   . Diabetes Father   . Stroke Father   . Cancer Other      Social History   Tobacco Use  . Smoking status: Never Smoker  . Smokeless tobacco: Never Used  Substance Use Topics  . Alcohol use: No    With staff assistance, above reviewed with the patient today.  ROS: As per HPI, otherwise no specific complaints on a limited and focused system review   No results found for this or any previous visit (from the past 72 hour(s)).   PHQ2/9: Depression screen [facility] 2/9 03/10/2020 11/26/2019 10/20/2017 04/27/2016  Decreased Interest 0 0 0 0  Down, Depressed, Hopeless 0 0 0 0  PHQ - 2 Score 0 0 0 0  Altered sleeping 0 0 1 -  Tired, decreased energy 0 0 0 -  Change in appetite 0 0 0 -  Feeling bad or failure about yourself  0 0 0 -  Trouble concentrating 0 0 0 -  Moving slowly or fidgety/restless 0 0 0 -  Suicidal thoughts 0 0 0 -  PHQ-9 Score 0 0 1 -  Difficult doing work/chores Not difficult at all Not difficult at all Not difficult at all -   PHQ-2/9 Result is neg  Fall Risk: Fall Risk  03/10/2020 11/26/2019 10/20/2017 04/27/2016  Falls in the past year? 0 0 No No  Number falls in past yr: 0 0 - -  Injury with Fall? 0 0 - -  Follow up - Falls evaluation completed - -      Objective:   Vitals:   03/10/20 0935  BP: (!) 120/86  Pulse: 89  Resp: 16  Temp: 97.9 F (36.6 C)  TempSrc: Temporal  SpO2: 99%  Weight: (!) 230 lb 11.2 oz (104.6 kg)  Height: 5\' 8"  (1.727 m)    Body mass index is 35.08 kg/m.  Physical Exam   NAD, masked, pleasant, overweight HEENT - [place]/AT, sclera anicteric, Car - RRR, not tachycardic Ext - Right Ankle - Limited ROM with dorsi and plantar flexion at extremes due to pain and mild swelling (last 10-15 degrees), no increase in pain with resistance Nontender over lateral malleolus and the ATF, nontender in the syndesmotic region, tender in the medial malleolus region, more posterior towards the Achilles and extendng  inferior to the medial malleolus and slightly towards the lateral aspect of the foot. No bruising present, no increased erythema or warmth NT distal fibula NT 5th MT Squeeze test neg Ant drawer test neg Neurovasc intact in foot The right great toe was nontender over the MTP and IP joints, and nontender over the metatarsal.  No increased erythema, increased warmth, or swelling of the great toe was present.  He had good motion of the great toe and the other toes as well on the right foot.  No erythema or increased warmth of the foot diffusely noted. Neuro/psychiatric - affect was not flat, appropriate with conversation  Alert with speech normal    Results for orders placed or performed in visit on 11/26/19  COMPLETE METABOLIC PANEL WITH GFR  Result Value Ref Range   Glucose, Bld 93 65 - 99 mg/dL   BUN 17 7 - 25 mg/dL   Creat 0.09 3.81 - 8.29 mg/dL   GFR, Est Non African American 69 > OR = 60 mL/min/1.56m2   GFR, Est African American 80 > OR = 60 mL/min/1.62m2   BUN/Creatinine Ratio NOT APPLICABLE 6 - 22 (calc)   Sodium 139 135 - 146 mmol/L   Potassium 4.0 3.5 - 5.3 mmol/L   Chloride 103 98 - 110 mmol/L   CO2 29 20 - 32 mmol/L   Calcium 9.6 8.6 - 10.3 mg/dL   Total Protein 7.3 6.1 - 8.1 g/dL   Albumin 4.4 3.6 - 5.1 g/dL   Globulin 2.9 1.9 - 3.7 g/dL (calc)   AG Ratio 1.5 1.0 - 2.5 (calc)   Total Bilirubin 0.3 0.2 - 1.2 mg/dL   Alkaline phosphatase (APISO) 35 (L) 36 - 130 U/L   AST 21 10 - 40 U/L   ALT 21 9 - 46 U/L  CBC with Differential/Platelet  Result Value Ref Range   WBC 6.9 3.8 - 10.8 Thousand/uL   RBC 4.63 4.20 - 5.80 Million/uL   Hemoglobin 14.4 13.2 - 17.1 g/dL   HCT 93.7 38 - 50 %   MCV 94.0 80.0 - 100.0 fL   MCH 31.1 27.0 - 33.0 pg   MCHC 33.1 32.0 - 36.0 g/dL   RDW 16.9 67.8 - 93.8 %   Platelets 223 140 - 400 Thousand/uL   MPV 11.8 7.5 - 12.5 fL   Neutro Abs 2,981 1,500 - 7,800 cells/uL   Lymphs Abs 3,098 850 - 3,900 cells/uL   Absolute Monocytes 552 200 - 950  cells/uL   Eosinophils Absolute 221 15 - 500 cells/uL   Basophils Absolute 48 0 - 200 cells/uL   Neutrophils Relative % 43.2 %   Total Lymphocyte 44.9 %   Monocytes Relative 8.0 %   Eosinophils Relative 3.2 %   Basophils Relative 0.7 %  Lipid panel  Result Value Ref Range   Cholesterol 183 <200 mg/dL   HDL 33 (L) > OR = 40 mg/dL   Triglycerides 101 <751 mg/dL   LDL Cholesterol (Calc) 125 (H) mg/dL (calc)   Total CHOL/HDL Ratio 5.5 (H) <5.0 (calc)   Non-HDL Cholesterol (Calc) 150 (H) <130 mg/dL (calc)  Hemoglobin W2H  Result Value Ref Range   Hgb A1c MFr Bld 5.8 (H) <5.7 % of total Hgb   Mean Plasma Glucose 120 (calc)   eAG (mmol/L) 6.6 (calc)  Hepatitis C antibody  Result Value Ref Range   Hepatitis C Ab NON-REACTIVE NON-REACTI   SIGNAL TO CUT-OFF 0.02 <1.00  HIV Antibody (routine testing w rflx)  Result Value Ref Range   HIV 1&2 Ab, 4th Generation NON-REACTIVE NON-REACTI  Uric acid  Result Value Ref Range   Uric Acid, Serum 8.1 (H) 4.0 - 8.0 mg/dL       Assessment & Plan:   1. Acute right ankle pain 2. Posterior tibial tendinitis of right lower extremity 3. Acute gout  of right ankle, unspecified cause/hyperuricemia history Discussed at length my concerns that gout may be playing a role with his current symptoms.  He had no prior trauma he could recall.  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 Given the tenderness in the posterior tibial tendon complex region, also feel may have an element of a posterior tibial tendinitis concern contributing to his symptoms.  He was walking around in water shoes at the water park the day before his symptoms began the next morning, which could have been a source to a strain/tendinitis developing. Noted may have elements of both contributing. Feel much less likely any fracture concern, although did note will need an x-ray if not improving over the next few days to ensure  that is not the case.   Can apply ice to the ankle at 10 to 15-minute intervals several times a day to help with pain and swelling Also keep the ankle elevated as much as possible Relative rest as we discussed as needed in the short-term Can apply an Ace wrap around the ankle for compression intermittently in the short-term Also can take ibuprofen products-up to 600 mg (3 tablets) 3-4 times a day as needed with food, but if bothering the stomach, would use Tylenol in its place.  Also continue the Protonix while taking the ibuprofen short-term.  If symptoms not improving or more problematic as the week is progressing, he needs to follow-up, will need to get an x-ray, and await that further assessment. He noted he will not be returning to his work at [employer] this week, which does give him the time to help with the conservative measures above, and await his response.  Jamelle HaringLIFFORD D , MD 03/10/20 9:55 AM

## 2020-03-10 NOTE — Patient Instructions (Signed)
Can apply ice to the ankle at 10 to 15-minute intervals several times a day to help with pain and swelling Also keep the ankle elevated as much as possible Relative rest as we discussed as needed in the short-term Can apply an Ace wrap around the ankle for compression intermittently in the short-term Also can take ibuprofen products-up to 600 mg (3 tablets) 3-4 times a day as needed with food, but if bothering the stomach, would use Tylenol in its place

## 2020-04-02 ENCOUNTER — Other Ambulatory Visit: Payer: Self-pay

## 2020-04-02 ENCOUNTER — Ambulatory Visit (INDEPENDENT_AMBULATORY_CARE_PROVIDER_SITE_OTHER): Payer: BC Managed Care – PPO

## 2020-04-02 ENCOUNTER — Ambulatory Visit (INDEPENDENT_AMBULATORY_CARE_PROVIDER_SITE_OTHER): Payer: BC Managed Care – PPO | Admitting: Podiatry

## 2020-04-02 ENCOUNTER — Encounter: Payer: Self-pay | Admitting: Podiatry

## 2020-04-02 ENCOUNTER — Other Ambulatory Visit: Payer: Self-pay | Admitting: Podiatry

## 2020-04-02 DIAGNOSIS — M899 Disorder of bone, unspecified: Secondary | ICD-10-CM | POA: Diagnosis not present

## 2020-04-02 DIAGNOSIS — M949 Disorder of cartilage, unspecified: Secondary | ICD-10-CM

## 2020-04-02 DIAGNOSIS — M779 Enthesopathy, unspecified: Secondary | ICD-10-CM

## 2020-04-02 DIAGNOSIS — M19071 Primary osteoarthritis, right ankle and foot: Secondary | ICD-10-CM | POA: Diagnosis not present

## 2020-04-02 DIAGNOSIS — M19079 Primary osteoarthritis, unspecified ankle and foot: Secondary | ICD-10-CM

## 2020-04-02 MED ORDER — IBUPROFEN 800 MG PO TABS: 800.0000 mg | ORAL_TABLET | Freq: Three times a day (TID) | ORAL | 1 refills | Status: DC | PRN

## 2020-04-02 MED ORDER — KETOROLAC TROMETHAMINE 10 MG PO TABS: 10.0000 mg | ORAL_TABLET | Freq: Four times a day (QID) | ORAL | 0 refills | Status: DC | PRN

## 2020-04-03 ENCOUNTER — Ambulatory Visit: Payer: BC Managed Care – PPO | Admitting: Podiatry

## 2020-04-03 NOTE — Progress Notes (Signed)
Subjective:  Patient ID: Dustin Brady, male    DOB: 11-Oct-1970,  MRN: [id number]  Chief Complaint  Patient presents with  . Ankle Pain    Patient presents today for right ankle pain and swelling for several weeks, but the past two weeks have been very bad.    49 y.o. male presents with the above complaint.  Patient presents with complaint of right ankle pain that has been going on for past 2 weeks has progressive gotten worse.  Patient states that this pain has been very painful.  Patient states that he has high pain tolerance however this last 2 weeks have been terrible.  He states it is painful to walk on a sharp shooting pain pain goes out while walking.  Pain goes up the leg to at the end of the day.  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  Patient has taken out colchicine as well as allopurinol.  He denies any other acute complaints.  He has not seen anyone else prior to seeing me for this.   Review of Systems: Negative except as noted in the HPI. Denies N/V/F/Ch.  Past Medical History:  Diagnosis Date  . Chronic reflux esophagitis 10/05/2016  . Gastritis 10/05/2016   Mild to moderate chronic gastritis, EGD Jan 2018, Dr. Marva Panda  . GERD (gastroesophageal reflux disease)     Current Outpatient Medications:  .  allopurinol (ZYLOPRIM) 100 MG tablet, Take 1 tablet (100 mg total) by mouth 2 (two) times daily. (Patient taking differently: Take 100 mg by mouth 2 (two) times daily. Does not take all the time), Disp: 60 tablet, Rfl: 5 .  Cholecalciferol (VITAMIN D) 50 MCG (2000 UT) CAPS, Take 1 capsule (2,000 Units total) by mouth daily. (Patient taking differently: Take 1 capsule by mouth daily. Every other day), Disp: 30 capsule, Rfl: 0 .  ibuprofen (ADVIL) 800 MG tablet, Take 1 tablet (800 mg total) by mouth every 8 (eight) hours as needed., Disp: 30 tablet, Rfl: 1 .  ketorolac (TORADOL) 10 MG tablet, Take 1 tablet (10 mg total) by mouth every  6 (six) hours as needed., Disp: 20 tablet, Rfl: 0 .  pantoprazole (PROTONIX) 20 MG tablet, , Disp: , Rfl:  .  pantoprazole (PROTONIX) 40 MG tablet, Take 1 tablet (40 mg total) by mouth daily., Disp: 90 tablet, Rfl: 0 .  tadalafil (CIALIS) 20 MG tablet, Take 0.5-1 tablets (10-20 mg total) by mouth every other day as needed for erectile dysfunction., Disp: 30 tablet, Rfl: 0  Social History   Tobacco Use  Smoking Status Never Smoker  Smokeless Tobacco Never Used    No Known Allergies Objective:  There were no vitals filed for this visit. There is no height or weight on file to calculate BMI. Constitutional Well developed. Well nourished.  Vascular Dorsalis pedis pulses palpable bilaterally. Posterior tibial pulses palpable bilaterally. Capillary refill normal to all digits.  No cyanosis or clubbing noted. Pedal hair growth normal.  Neurologic Normal speech. Oriented to person, place, and time. Epicritic sensation to light touch grossly present bilaterally.  Dermatologic Nails well groomed and normal in appearance. No open wounds. No skin lesions.  Orthopedic:  Pain on palpation to the medial and lateral ankle gutter.  Pain with range of motion of the ankle joint.  No intra-articular crepitus noted.  Subjective intra-articular pain noted.  No pain at the Achilles tendon, peroneal tendon, posterior tibial tendon, ATFL.  Generalized swelling noted to the circumferentially  around the ankle.   Radiographs: 3 views of skeletally mature adult right ankle: Possible osteochondral lesion noted to the lateral dome of the talus.  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  No fractures noted.  Congruent ankle joint noted. Assessment:   1. Ankle arthritis   2. Osteochondral lesion    Plan:  Patient was evaluated and treated and all questions answered.  Right ankle arthritis versus osteochondral lesion -I explained to patient the etiology of OCD lesion and various treatment options  were extensively discussed.  Given that I cannot somewhat appreciate the OCD lesion on a radiograph, I am worried that this lesion might be more pronounced than it is on radiograph.  At this time given the amount of pain that is having I believe he will benefit from a steroid injection as well as a cam boot immobilization to aggressively take the stress off of the ankle.  I will also take him out of work for next 4 weeks.  Patient states understanding.  If there is no improvement will consider getting an MRI to evaluate the depth and nature of the OCD lesion followed by surgical planning.  No follow-ups on file.

## 2020-04-10 DIAGNOSIS — M79676 Pain in unspecified toe(s): Secondary | ICD-10-CM

## 2020-04-14 ENCOUNTER — Other Ambulatory Visit: Payer: Self-pay | Admitting: Family Medicine

## 2020-04-14 DIAGNOSIS — N529 Male erectile dysfunction, unspecified: Secondary | ICD-10-CM

## 2020-04-14 MED ORDER — TADALAFIL 20 MG PO TABS: 10.0000 mg | ORAL_TABLET | ORAL | 0 refills | Status: DC | PRN

## 2020-04-30 ENCOUNTER — Other Ambulatory Visit: Payer: Self-pay

## 2020-04-30 ENCOUNTER — Encounter: Payer: Self-pay | Admitting: Podiatry

## 2020-04-30 ENCOUNTER — Ambulatory Visit (INDEPENDENT_AMBULATORY_CARE_PROVIDER_SITE_OTHER): Payer: BC Managed Care – PPO | Admitting: Podiatry

## 2020-04-30 DIAGNOSIS — M76821 Posterior tibial tendinitis, right leg: Secondary | ICD-10-CM

## 2020-04-30 DIAGNOSIS — Q666 Other congenital valgus deformities of feet: Secondary | ICD-10-CM

## 2020-04-30 DIAGNOSIS — M7671 Peroneal tendinitis, right leg: Secondary | ICD-10-CM

## 2020-04-30 DIAGNOSIS — M79676 Pain in unspecified toe(s): Secondary | ICD-10-CM

## 2020-05-01 ENCOUNTER — Encounter: Payer: Self-pay | Admitting: Podiatry

## 2020-05-01 NOTE — Progress Notes (Signed)
Subjective:  Patient ID: Dustin Brady, male    DOB: 17-Oct-1970,  MRN: [id number]  Chief Complaint  Patient presents with   Ankle Pain    "its better, but having sharp pains from top of foot to medial ankle"    49 y.o. male presents with the above complaint.  Patient presents with complaint of right ankle pain that has been going on for past 2 weeks has progressive gotten worse.  Patient states that this pain has been very painful.  Patient states that he has high pain tolerance however this last 2 weeks have been terrible.  He states it is painful to walk on a sharp shooting pain pain goes out while walking.  Pain goes up the leg to at the end of the day.  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  Patient has taken out colchicine as well as allopurinol.  He denies any other acute complaints.  He has not seen anyone else prior to seeing me for this.   Review of Systems: Negative except as noted in the HPI. Denies N/V/F/Ch.  Past Medical History:  Diagnosis Date   Chronic reflux esophagitis 10/05/2016   Gastritis 10/05/2016   Mild to moderate chronic gastritis, EGD Jan 2018, Dr. Marva Panda   GERD (gastroesophageal reflux disease)     Current Outpatient Medications:    allopurinol (ZYLOPRIM) 100 MG tablet, Take 1 tablet (100 mg total) by mouth 2 (two) times daily. (Patient taking differently: Take 100 mg by mouth 2 (two) times daily. Does not take all the time), Disp: 60 tablet, Rfl: 5   Cholecalciferol (VITAMIN D) 50 MCG (2000 UT) CAPS, Take 1 capsule (2,000 Units total) by mouth daily. (Patient taking differently: Take 1 capsule by mouth daily. Every other day), Disp: 30 capsule, Rfl: 0   ibuprofen (ADVIL) 800 MG tablet, Take 1 tablet (800 mg total) by mouth every 8 (eight) hours as needed., Disp: 30 tablet, Rfl: 1   ketorolac (TORADOL) 10 MG tablet, Take 1 tablet (10 mg total) by mouth every 6 (six) hours as needed., Disp: 20 tablet, Rfl:  0   pantoprazole (PROTONIX) 20 MG tablet, , Disp: , Rfl:    pantoprazole (PROTONIX) 40 MG tablet, Take 1 tablet (40 mg total) by mouth daily., Disp: 90 tablet, Rfl: 0   tadalafil (CIALIS) 20 MG tablet, Take 0.5-1 tablets (10-20 mg total) by mouth every other day as needed for erectile dysfunction., Disp: 30 tablet, Rfl: 0  Social History   Tobacco Use  Smoking Status Never Smoker  Smokeless Tobacco Never Used    No Known Allergies Objective:  There were no vitals filed for this visit. There is no height or weight on file to calculate BMI. Constitutional Well developed. Well nourished.  Vascular Dorsalis pedis pulses palpable bilaterally. Posterior tibial pulses palpable bilaterally. Capillary refill normal to all digits.  No cyanosis or clubbing noted. Pedal hair growth normal.  Neurologic Normal speech. Oriented to person, place, and time. Epicritic sensation to light touch grossly present bilaterally.  Dermatologic Nails well groomed and normal in appearance. No open wounds. No skin lesions.  Orthopedic:  Pain on palpation to the medial and lateral ankle gutter.  Pain with range of motion of the ankle joint.  No intra-articular crepitus noted.  Subjective intra-articular pain noted.  No pain at the Achilles tendon, peroneal tendon, posterior tibial tendon, ATFL.  Generalized swelling noted to the circumferentially around the ankle.   Radiographs: 3 views  of skeletally mature adult right ankle: Possible osteochondral lesion noted to the lateral dome of the talus.  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  No fractures noted.  Congruent ankle joint noted. Assessment:   1. Posterior tibial tendinitis of right lower extremity   2. Peroneal tendinitis of right lower leg   3. Pes planovalgus    Plan:  Patient was evaluated and treated and all questions answered.  Right ankle arthritis versus osteochondral lesion -Resolved  Right posterior tibial tendinitis and  peroneal tendinitis -I believe the cam boot immobilization allowed [place] to properly identify exactly what was hurting him which appears to be along the course of these tendons.  Patient states the pain is better however given the nature of the inflammation I believe he will benefit from Tri-Lock ankle brace to give stability.  Ultimately the driving force for those tendons being aggravated is the underlying pes planovalgus deformity and correction of the deformity will ultimately give him relief.  I discussed this with the patient extensive detail    Bilateral pes planovalgus rigid -Iexplained to the patient the etiology of pes planovalgus and various treatment options were extensively discussed.  Given the rigidity of the foot I believe he will benefit from custom-made orthotics to help support the limited arch that he has as well as control the hindfoot motion and therefore taken the stress off of the tendon.  Patient states understanding and will obtain custom-made orthotics -He will be scheduled see rec for custom-made orthotics  No follow-ups on file.

## 2020-05-06 ENCOUNTER — Ambulatory Visit (INDEPENDENT_AMBULATORY_CARE_PROVIDER_SITE_OTHER): Payer: BC Managed Care – PPO | Admitting: Orthotics

## 2020-05-06 ENCOUNTER — Other Ambulatory Visit: Payer: Self-pay

## 2020-05-06 DIAGNOSIS — M7671 Peroneal tendinitis, right leg: Secondary | ICD-10-CM

## 2020-05-06 DIAGNOSIS — Q666 Other congenital valgus deformities of feet: Secondary | ICD-10-CM

## 2020-05-06 DIAGNOSIS — M76822 Posterior tibial tendinitis, left leg: Secondary | ICD-10-CM | POA: Diagnosis not present

## 2020-05-06 DIAGNOSIS — M76821 Posterior tibial tendinitis, right leg: Secondary | ICD-10-CM | POA: Diagnosis not present

## 2020-05-06 NOTE — Progress Notes (Signed)
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  Plan f/o with longitudal/transverse arch support, deep heel seat for RF stability/ 1/4 lift b/l

## 2020-05-21 ENCOUNTER — Ambulatory Visit (INDEPENDENT_AMBULATORY_CARE_PROVIDER_SITE_OTHER): Payer: BC Managed Care – PPO | Admitting: Podiatry

## 2020-05-21 ENCOUNTER — Other Ambulatory Visit: Payer: Self-pay

## 2020-05-21 ENCOUNTER — Encounter: Payer: Self-pay | Admitting: Podiatry

## 2020-05-21 DIAGNOSIS — Q666 Other congenital valgus deformities of feet: Secondary | ICD-10-CM

## 2020-05-21 DIAGNOSIS — M7671 Peroneal tendinitis, right leg: Secondary | ICD-10-CM

## 2020-05-21 DIAGNOSIS — M76821 Posterior tibial tendinitis, right leg: Secondary | ICD-10-CM | POA: Diagnosis not present

## 2020-05-22 ENCOUNTER — Encounter: Payer: Self-pay | Admitting: Podiatry

## 2020-05-22 ENCOUNTER — Other Ambulatory Visit: Payer: Self-pay | Admitting: Family Medicine

## 2020-05-22 NOTE — Progress Notes (Signed)
Subjective:  Patient ID: Dustin Brady, male    DOB: Jul 03, 1971,  MRN: [id number]  Chief Complaint  Patient presents with  . Ankle Pain    49 y.o. male presents with the above complaint.  Patient presents with a follow-up of right peroneal tendinitis and posterior tibial tendinitis secondary to pes planovalgus deformity.  He states is doing a lot better he does not have any further pain.  The ankle brace has helped considerably.  He is awaiting orthotics.   Review of Systems: Negative except as noted in the HPI. Denies N/V/F/Ch.  Past Medical History:  Diagnosis Date  . Chronic reflux esophagitis 10/05/2016  . Gastritis 10/05/2016   Mild to moderate chronic gastritis, EGD Jan 2018, Dr. Marva Panda  . GERD (gastroesophageal reflux disease)     Current Outpatient Medications:  .  allopurinol (ZYLOPRIM) 100 MG tablet, TAKE 1 TABLET BY MOUTH TWICE A DAY, Disp: 180 tablet, Rfl: 1 .  Cholecalciferol (VITAMIN D) 50 MCG (2000 UT) CAPS, Take 1 capsule (2,000 Units total) by mouth daily. (Patient taking differently: Take 1 capsule by mouth daily. Every other day), Disp: 30 capsule, Rfl: 0 .  ibuprofen (ADVIL) 800 MG tablet, Take 1 tablet (800 mg total) by mouth every 8 (eight) hours as needed., Disp: 30 tablet, Rfl: 1 .  ketorolac (TORADOL) 10 MG tablet, Take 1 tablet (10 mg total) by mouth every 6 (six) hours as needed., Disp: 20 tablet, Rfl: 0 .  pantoprazole (PROTONIX) 20 MG tablet, , Disp: , Rfl:  .  pantoprazole (PROTONIX) 40 MG tablet, Take 1 tablet (40 mg total) by mouth daily., Disp: 90 tablet, Rfl: 0 .  tadalafil (CIALIS) 20 MG tablet, Take 0.5-1 tablets (10-20 mg total) by mouth every other day as needed for erectile dysfunction., Disp: 30 tablet, Rfl: 0  Social History   Tobacco Use  Smoking Status Never Smoker  Smokeless Tobacco Never Used    No Known Allergies Objective:  There were no vitals filed for this visit. There is no height or weight on file to calculate  BMI. Constitutional Well developed. Well nourished.  Vascular Dorsalis pedis pulses palpable bilaterally. Posterior tibial pulses palpable bilaterally. Capillary refill normal to all digits.  No cyanosis or clubbing noted. Pedal hair growth normal.  Neurologic Normal speech. Oriented to person, place, and time. Epicritic sensation to light touch grossly present bilaterally.  Dermatologic Nails well groomed and normal in appearance. No open wounds. No skin lesions.  Orthopedic:  No pain on palpation to the medial and lateral ankle gutter.  No pain with range of motion of the ankle joint.  No intra-articular crepitus noted.  Subjective intra-articular pain noted.  No pain at the Achilles tendon, peroneal tendon, posterior tibial tendon, ATFL.  Generalized swelling noted to the circumferentially around the ankle.   Radiographs: 3 views of skeletally mature adult right ankle: Possible osteochondral lesion noted to the lateral dome of the talus.  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  No fractures noted.  Congruent ankle joint noted. Assessment:   1. Posterior tibial tendinitis of right lower extremity   2. Peroneal tendinitis of right lower leg   3. Pes planovalgus    Plan:  Patient was evaluated and treated and all questions answered.  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  Patient will now transition from Tri-Lock ankle brace into regular shoes with orthotics in them.  However his orthotics are not  dense I encouraged him to continue using Tri-Lock brace until then.  If he is having a hard time transitioning have asked him to come back and see me. -If any foot and ankle issues arise in the future I have asked him to come back and see me.  Patient states understanding     Bilateral pes planovalgus rigid -Iexplained to the patient the etiology of pes  planovalgus and various treatment options were extensively discussed.  Given the rigidity of the foot I believe he will benefit from custom-made orthotics to help support the limited arch that he has as well as control the hindfoot motion and therefore taken the stress off of the tendon.  Patient states understanding and will obtain custom-made orthotics -He is awaiting orthotics to be made and utilized  No follow-ups on file.

## 2020-06-01 ENCOUNTER — Other Ambulatory Visit: Payer: Self-pay

## 2020-06-01 ENCOUNTER — Ambulatory Visit: Payer: BC Managed Care – PPO | Admitting: Orthotics

## 2020-06-01 DIAGNOSIS — M7671 Peroneal tendinitis, right leg: Secondary | ICD-10-CM

## 2020-06-01 DIAGNOSIS — M76821 Posterior tibial tendinitis, right leg: Secondary | ICD-10-CM

## 2020-06-01 DIAGNOSIS — Q666 Other congenital valgus deformities of feet: Secondary | ICD-10-CM

## 2020-06-01 NOTE — Progress Notes (Signed)
Patient came in today to pick up custom made foot orthotics.  The goals were accomplished and the patient reported no dissatisfaction with said orthotics.  Patient was advised of breakin period and how to report any issues. 

## 2020-06-28 ENCOUNTER — Other Ambulatory Visit: Payer: Self-pay | Admitting: Family Medicine

## 2020-06-28 DIAGNOSIS — N529 Male erectile dysfunction, unspecified: Secondary | ICD-10-CM

## 2020-06-29 MED ORDER — TADALAFIL 20 MG PO TABS: 10.0000 mg | ORAL_TABLET | ORAL | 0 refills | Status: DC | PRN

## 2020-07-02 ENCOUNTER — Other Ambulatory Visit: Payer: Self-pay | Admitting: Podiatry

## 2020-07-11 ENCOUNTER — Other Ambulatory Visit: Payer: Self-pay | Admitting: Podiatry

## 2020-09-21 ENCOUNTER — Other Ambulatory Visit: Payer: Self-pay | Admitting: Family Medicine

## 2020-09-21 DIAGNOSIS — N529 Male erectile dysfunction, unspecified: Secondary | ICD-10-CM

## 2020-09-22 ENCOUNTER — Encounter: Payer: Self-pay | Admitting: Family Medicine

## 2020-09-22 ENCOUNTER — Other Ambulatory Visit: Payer: Self-pay

## 2020-09-22 ENCOUNTER — Telehealth: Payer: Self-pay | Admitting: Family Medicine

## 2020-09-22 ENCOUNTER — Ambulatory Visit: Payer: BC Managed Care – PPO | Admitting: Family Medicine

## 2020-09-22 VITALS — BP 120/80 | HR 88 | Temp 98.1°F | Resp 18 | Ht 72.0 in | Wt 218.3 lb

## 2020-09-22 DIAGNOSIS — M25571 Pain in right ankle and joints of right foot: Secondary | ICD-10-CM | POA: Diagnosis not present

## 2020-09-22 DIAGNOSIS — N529 Male erectile dysfunction, unspecified: Secondary | ICD-10-CM

## 2020-09-22 DIAGNOSIS — K219 Gastro-esophageal reflux disease without esophagitis: Secondary | ICD-10-CM | POA: Diagnosis not present

## 2020-09-22 DIAGNOSIS — J208 Acute bronchitis due to other specified organisms: Secondary | ICD-10-CM

## 2020-09-22 DIAGNOSIS — G8929 Other chronic pain: Secondary | ICD-10-CM

## 2020-09-22 DIAGNOSIS — E790 Hyperuricemia without signs of inflammatory arthritis and tophaceous disease: Secondary | ICD-10-CM

## 2020-09-22 DIAGNOSIS — U071 COVID-19: Secondary | ICD-10-CM

## 2020-09-22 MED ORDER — ALBUTEROL SULFATE HFA 108 (90 BASE) MCG/ACT IN AERS: 2.0000 | INHALATION_SPRAY | Freq: Four times a day (QID) | RESPIRATORY_TRACT | 0 refills | Status: DC | PRN

## 2020-09-22 MED ORDER — BENZONATATE 100 MG PO CAPS: 100.0000 mg | ORAL_CAPSULE | Freq: Three times a day (TID) | ORAL | 0 refills | Status: DC | PRN

## 2020-09-22 MED ORDER — PANTOPRAZOLE SODIUM 20 MG PO TBEC: 20.0000 mg | DELAYED_RELEASE_TABLET | Freq: Every day | ORAL | 1 refills | Status: DC

## 2020-09-22 MED ORDER — TADALAFIL 20 MG PO TABS: 10.0000 mg | ORAL_TABLET | ORAL | 1 refills | Status: DC | PRN

## 2020-09-22 NOTE — Telephone Encounter (Signed)
Patient is calling again to have his medication sent to [facility] and he needs it to be the original brand that he used before, because the new script is too expensive according to his insurance.  Please call patient to verify and discuss further at [phone]

## 2020-09-22 NOTE — Progress Notes (Signed)
Patient ID: Dustin Brady, male    DOB: June 30, 1971, 50 y.o.   MRN: [id number]  PCP: Alba Cory, MD  Chief Complaint  Patient presents with  . Cough  . Shortness of Breath    Subjective:   Dustin Brady is a 50 y.o. male, presents to clinic with CC of the following:  HPI  COVID over a month ago around christmas to early January was very symptomatic He has had continued but gradually improving fatigue, but prolonged DOE/SOB coughing bits - different triggers-including hot air, doing a lot of physical exertion at work -no identified triggers at all will sometimes cause coughing fits shortness of breath and having to stop and catch his breath other days he did have 1 to 2 days where he feels fine and back to his normal without a lot of concerning respiratory symptoms He does have some productive sputum that is mild he denies any chest pain, chest pressure, fever, chills, night sweats Further denies any palpitations, orthopnea, PND, lower extremity edema He has a very strong family history of asthma and reactive airway and multiple family members that he lives with but he himself has never had asthma or needed to use an inhaler While having Covid symptoms and over the past month he did not have any evaluations x-rays or medications given to him or prescribed He is waiting for his body to take care of it He has not tried any over-the-counter medications  He does have a history of acid reflux and would like to know if he can get his pantoprazole refilled he has requested it but it has been declined and he is not sure why he previously started on 40 mg pantoprazole did consult with a specialist and was able to decrease the dose to 20 mg daily.  He currently denies any abdominal pain, GI upset, bowel changes, melena, hematochezia  He also would like to get his Cialis refilled  He wants allopurinol taken off his chart states that he does not believe he ever had gout and when his ankle pain  was more severe he got an x-ray which showed that he had a fracture-so is not taking allopurinol and doubts that he ever had gout Reviewed Dr. Allena Katz podiatry OV with Xrays -  Radiology interpretation is the following: Radiographs: 3 views of skeletally mature adult right ankle: Possible osteochondral lesion noted to the lateral dome of the talus.  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  No fractures noted.  Congruent ankle joint noted.  Possible osteochondral lesion and mild arthritic changes without a fracture -patient is still dealing with some pain and arthritis flares  He is no longer taking allopurinol or colchicine he uses NSAIDs only rarely when needed for severe pain   Patient Active Problem List   Diagnosis Date Noted  . Chronic reflux esophagitis 10/05/2016  . Gastritis 10/05/2016  . Night sweats 04/27/2016      Current Outpatient Medications:  .  Cholecalciferol (VITAMIN D) 50 MCG (2000 UT) CAPS, Take 1 capsule (2,000 Units total) by mouth daily., Disp: 30 capsule, Rfl: 0 .  ibuprofen (ADVIL) 800 MG tablet, Take 1 tablet (800 mg total) by mouth every 8 (eight) hours as needed., Disp: 30 tablet, Rfl: 1 .  ketorolac (TORADOL) 10 MG tablet, Take 1 tablet (10 mg total) by mouth every 6 (six) hours as needed., Disp: 20 tablet, Rfl: 0 .  pantoprazole (PROTONIX) 40 MG tablet, Take 1 tablet (40 mg total) by mouth daily.,  Disp: 90 tablet, Rfl: 0 .  tadalafil (CIALIS) 20 MG tablet, Take 0.5-1 tablets (10-20 mg total) by mouth every other day as needed for erectile dysfunction., Disp: 30 tablet, Rfl: 0 .  allopurinol (ZYLOPRIM) 100 MG tablet, TAKE 1 TABLET BY MOUTH TWICE A DAY (Patient not taking: Reported on 09/22/2020), Disp: 180 tablet, Rfl: 1 .  pantoprazole (PROTONIX) 20 MG tablet, , Disp: , Rfl:    No Known Allergies   Social History   Tobacco Use  . Smoking status: Never Smoker  . Smokeless tobacco: Never Used  Vaping Use  . Vaping Use: Never used  Substance  Use Topics  . Alcohol use: No  . Drug use: No      Chart Review Today: I personally reviewed active problem list, medication list, allergies, family history, social history, health maintenance, notes from last encounter, lab results, imaging with the patient/caregiver today.   Review of Systems  Constitutional: Negative.   HENT: Negative.   Eyes: Negative.   Respiratory: Negative.   Cardiovascular: Negative.   Gastrointestinal: Negative.   Endocrine: Negative.   Genitourinary: Negative.   Musculoskeletal: Negative.   Skin: Negative.   Allergic/Immunologic: Negative.   Neurological: Negative.   Hematological: Negative.   Psychiatric/Behavioral: Negative.   All other systems reviewed and are negative.      Objective:   Vitals:   09/22/20 1034  BP: 120/80  Pulse: 88  Resp: 18  Temp: 98.1 F (36.7 C)  SpO2: 97%  Weight: 218 lb 4.8 oz (99 kg)  Height: 6' (1.829 m)    Body mass index is 29.61 kg/m.  Physical Exam Vitals and nursing note reviewed.  Constitutional:      General: He is not in acute distress.    Appearance: Normal appearance. He is well-developed. He is obese. He is not ill-appearing, toxic-appearing or diaphoretic.     Interventions: Face mask in place.  HENT:     Head: Normocephalic and atraumatic.     Jaw: No trismus.     Right Ear: External ear normal.     Left Ear: External ear normal.  Eyes:     General: Lids are normal. No scleral icterus.       Right eye: No discharge.        Left eye: No discharge.     Conjunctiva/sclera: Conjunctivae normal.  Neck:     Trachea: Trachea and phonation normal. No tracheal deviation.  Cardiovascular:     Rate and Rhythm: Normal rate and regular rhythm.     Pulses: Normal pulses.          Radial pulses are 2+ on the right side and 2+ on the left side.       Posterior tibial pulses are 2+ on the right side and 2+ on the left side.     Heart sounds: Normal heart sounds. No murmur heard. No friction rub.  No gallop.   Pulmonary:     Effort: Pulmonary effort is normal. No respiratory distress.     Breath sounds: Normal breath sounds. No stridor. No wheezing, rhonchi or rales.  Abdominal:     General: Bowel sounds are normal. There is no distension.     Palpations: Abdomen is soft.  Musculoskeletal:     Right lower leg: No edema.     Left lower leg: No edema.  Skin:    General: Skin is warm and dry.     Coloration: Skin is not jaundiced.     Findings: No rash.  Nails: There is no clubbing.  Neurological:     Mental Status: He is alert. Mental status is at baseline.     Cranial Nerves: No dysarthria or facial asymmetry.     Motor: No tremor or abnormal muscle tone.     Gait: Gait normal.  Psychiatric:        Mood and Affect: Mood normal.        Speech: Speech normal.        Behavior: Behavior normal. Behavior is cooperative.      Results for orders placed or performed in visit on 11/26/19  COMPLETE METABOLIC PANEL WITH GFR  Result Value Ref Range   Glucose, Bld 93 65 - 99 mg/dL   BUN 17 7 - 25 mg/dL   Creat 1.44 3.15 - 4.00 mg/dL   GFR, Est Non African American 69 > OR = 60 mL/min/1.76m2   GFR, Est African American 80 > OR = 60 mL/min/1.15m2   BUN/Creatinine Ratio NOT APPLICABLE 6 - 22 (calc)   Sodium 139 135 - 146 mmol/L   Potassium 4.0 3.5 - 5.3 mmol/L   Chloride 103 98 - 110 mmol/L   CO2 29 20 - 32 mmol/L   Calcium 9.6 8.6 - 10.3 mg/dL   Total Protein 7.3 6.1 - 8.1 g/dL   Albumin 4.4 3.6 - 5.1 g/dL   Globulin 2.9 1.9 - 3.7 g/dL (calc)   AG Ratio 1.5 1.0 - 2.5 (calc)   Total Bilirubin 0.3 0.2 - 1.2 mg/dL   Alkaline phosphatase (APISO) 35 (L) 36 - 130 U/L   AST 21 10 - 40 U/L   ALT 21 9 - 46 U/L  CBC with Differential/Platelet  Result Value Ref Range   WBC 6.9 3.8 - 10.8 Thousand/uL   RBC 4.63 4.20 - 5.80 Million/uL   Hemoglobin 14.4 13.2 - 17.1 g/dL   HCT 86.7 61.9 - 50.9 %   MCV 94.0 80.0 - 100.0 fL   MCH 31.1 27.0 - 33.0 pg   MCHC 33.1 32.0 - 36.0 g/dL    RDW 32.6 71.2 - 45.8 %   Platelets 223 140 - 400 Thousand/uL   MPV 11.8 7.5 - 12.5 fL   Neutro Abs 2,981 1,500 - 7,800 cells/uL   Lymphs Abs 3,098 850 - 3,900 cells/uL   Absolute Monocytes 552 200 - 950 cells/uL   Eosinophils Absolute 221 15 - 500 cells/uL   Basophils Absolute 48 0 - 200 cells/uL   Neutrophils Relative % 43.2 %   Total Lymphocyte 44.9 %   Monocytes Relative 8.0 %   Eosinophils Relative 3.2 %   Basophils Relative 0.7 %  Lipid panel  Result Value Ref Range   Cholesterol 183 <200 mg/dL   HDL 33 (L) > OR = 40 mg/dL   Triglycerides 099 <833 mg/dL   LDL Cholesterol (Calc) 125 (H) mg/dL (calc)   Total CHOL/HDL Ratio 5.5 (H) <5.0 (calc)   Non-HDL Cholesterol (Calc) 150 (H) <130 mg/dL (calc)  Hemoglobin A2N  Result Value Ref Range   Hgb A1c MFr Bld 5.8 (H) <5.7 % of total Hgb   Mean Plasma Glucose 120 (calc)   eAG (mmol/L) 6.6 (calc)  Hepatitis C antibody  Result Value Ref Range   Hepatitis C Ab NON-REACTIVE NON-REACTI   SIGNAL TO CUT-OFF 0.02 <1.00  HIV Antibody (routine testing w rflx)  Result Value Ref Range   HIV 1&2 Ab, 4th Generation NON-REACTIVE NON-REACTI  Uric acid  Result Value Ref Range   Uric Acid, Serum 8.1 (  H) 4.0 - 8.0 mg/dL   Ambulatory pulse ox done today in clinic -  Pulse ox remained normal no hypoxia he was not experiencing any dyspnea, no chest pain and heart rate remained in normal limits please see extended vitals for CMA documentation    Assessment & Plan:     ICD-10-CM   1. Acute bronchitis due to COVID-19 virus  U07.1 albuterol (VENTOLIN HFA) 108 (90 Base) MCG/ACT inhaler   J20.8 benzonatate (TESSALON) 100 MG capsule   Lungs clear, intermittent shortness of breath, coughing mildly productive, spasms, sounds like gradually improving bronchitis (see below)  2. Erectile dysfunction, unspecified erectile dysfunction type  N52.9 tadalafil (CIALIS) 20 MG tablet   Refill on Cialis -patient will need to follow-up with PCP at his next regularly  scheduled office visit  3. Gastroesophageal reflux disease, unspecified whether esophagitis present  K21.9 pantoprazole (PROTONIX) 20 MG tablet   Refill on Protonix 20 mg encouraged him to avoid alcohol, food triggers, NSAIDs, can use Pepcid 20 mg twice daily as needed, follow-up GI if any worsening  4. Chronic pain of right ankle  M25.571    G89.29    Onset last year between July and August, managed by podiatry/specialist, using NSAIDs as needed  5. Elevated uric acid in blood  E79.0    Still possibly history of gouty arthritis last uric acid 8.1 patient not taking allopurinol    Patient notes that he still not improved after having Covid little over a month ago having intermittent coughing fits which causes shortness of breath sometimes shortness of breath with physical exertion but then some days he has a few days where he can do strenuous work without any respiratory symptoms.  Overall his fatigue is gradually improving he denies chest pain, night sweats, fever.  He has not tried any over-the-counter medications  I reassured him that it does seem like he is gradually improving and it may take another few weeks or month or 2 to get back to his normal He does have a strong family history of asthma and I do suspect that he has some gradually improving bronchitis Ambulatory pulse ox was normal and reassuring Patient is well-appearing and lungs are clear on exam Encouraged him to use over-the-counter Mucinex and cough suppressants like Delsym or Robitussin Scented and rescue inhaler for him to try when he has bronchospasm/coughing fits or shortness of breath I did want to give him a burst of steroids but he noted that his GERD and GI sensitivity was very severe so we are going to avoid this for now I did offer him a IM injection of steroids but he declined  Encouraged him to follow-up in the next 2 weeks if his respiratory symptoms do not continue to gradually improve  If he has any respiratory  symptoms which are frequent, are interfering with his job duties or daily functions more than every other day or if he does not improve I do think it may be beneficial for him to try a maintenance inhaler with long-acting medicines for a month or so  F/up with PCP for next regularly scheduled exam with the patient states is in about 2 months  Reviewed conservative management, over-the-counter meds, offered steroids which she declined we will will try the inhaler first and if his respiratory symptoms are not gradually improving in the next 2 weeks he needs to follow-up or let me know about that so I can either refer him to pulmonology for further testing or we can try  a maintenance inhaler   Lung sounded good on exam and vital signs reassuring but since he had Covid over the holidays he has not had a chest x-ray that would also be appropriate to screen  -did not feel like we had to get that done today   Danelle Berry, PA-C 09/22/20 10:52 AM

## 2020-09-23 MED ORDER — TADALAFIL 20 MG PO TABS: 10.0000 mg | ORAL_TABLET | ORAL | 1 refills | Status: DC | PRN

## 2020-09-23 NOTE — Addendum Note (Signed)
Addended by: Danelle Berry on: 09/23/2020 01:49 PM   Modules accepted: Orders

## 2020-09-23 NOTE — Telephone Encounter (Signed)
Per your conversation with the patient he needs this to go to [facility]. Make sure that it is the same thing as before

## 2020-11-27 ENCOUNTER — Encounter: Payer: BC Managed Care – PPO | Admitting: Family Medicine

## 2020-11-30 ENCOUNTER — Other Ambulatory Visit: Payer: Self-pay

## 2020-11-30 ENCOUNTER — Encounter: Payer: Self-pay | Admitting: Family Medicine

## 2020-11-30 ENCOUNTER — Ambulatory Visit (INDEPENDENT_AMBULATORY_CARE_PROVIDER_SITE_OTHER): Payer: BC Managed Care – PPO | Admitting: Family Medicine

## 2020-11-30 VITALS — BP 122/70 | HR 86 | Temp 98.4°F | Resp 16 | Ht 72.0 in | Wt 220.0 lb

## 2020-11-30 DIAGNOSIS — Z1211 Encounter for screening for malignant neoplasm of colon: Secondary | ICD-10-CM

## 2020-11-30 DIAGNOSIS — E785 Hyperlipidemia, unspecified: Secondary | ICD-10-CM

## 2020-11-30 DIAGNOSIS — R7303 Prediabetes: Secondary | ICD-10-CM

## 2020-11-30 DIAGNOSIS — E790 Hyperuricemia without signs of inflammatory arthritis and tophaceous disease: Secondary | ICD-10-CM | POA: Diagnosis not present

## 2020-11-30 DIAGNOSIS — Z0000 Encounter for general adult medical examination without abnormal findings: Secondary | ICD-10-CM

## 2020-11-30 DIAGNOSIS — K219 Gastro-esophageal reflux disease without esophagitis: Secondary | ICD-10-CM

## 2020-11-30 NOTE — Patient Instructions (Signed)

## 2020-11-30 NOTE — Progress Notes (Signed)
Name: Dustin Brady   MRN: [id number]    DOB: [birth year]   Date:11/30/2020       Progress Note  Subjective  Chief Complaint  Annual Exam  HPI  Patient presents for annual CPE   IPSS Questionnaire (AUA-7): Over the past month.   1)  How often have you had a sensation of not emptying your bladder completely after you finish urinating?  0 - Not at all  2)  How often have you had to urinate again less than two hours after you finished urinating? 0 - Not at all  3)  How often have you found you stopped and started again several times when you urinated?  0 - Not at all  4) How difficult have you found it to postpone urination?  0 - Not at all  5) How often have you had a weak urinary stream?  0 - Not at all  6) How often have you had to push or strain to begin urination?  0 - Not at all  7) How many times did you most typically get up to urinate from the time you went to bed until the time you got up in the morning?  1 - 1 time  Total score:  0-7 mildly symptomatic   8-19 moderately symptomatic   20-35 severely symptomatic     Diet: discussed increase tree nuts intake, fish and physical activity Exercise: increase to 150 minutes per week   Depression: phq 9 is negative [facility] 2/9 11/30/2020 09/22/2020 03/10/2020 11/26/2019 10/20/2017  Decreased Interest 0 0 0 0 0  Down, Depressed, Hopeless 0 0 0 0 0  PHQ - 2 Score 0 0 0 0 0  Altered sleeping - 0 0 0 1  Tired, decreased energy - 0 0 0 0  Change in appetite - 0 0 0 0  Feeling bad or failure about yourself  - 0 0 0 0  Trouble concentrating - 0 0 0 0  Moving slowly or fidgety/restless - 0 0 0 0  Suicidal thoughts - 0 0 0 0  PHQ-9 Score - 0 0 0 1  Difficult doing work/chores - Not difficult at all Not difficult at all Not difficult at all Not difficult at all    Hypertension:  BP Readings from Last 3 Encounters:  11/30/20 122/70  09/22/20 120/80  03/10/20 (!) 120/86    Obesity: Wt Readings from Last 3 Encounters:   11/30/20 220 lb (99.8 kg)  09/22/20 218 lb 4.8 oz (99 kg)  03/10/20 (!) 230 lb 11.2 oz (104.6 kg)   BMI Readings from Last 3 Encounters:  11/30/20 29.84 kg/m  09/22/20 29.61 kg/m  03/10/20 35.08 kg/m     Lipids:  Lab Results  Component Value Date   CHOL 183 11/26/2019   Lab Results  Component Value Date   HDL 33 (L) 11/26/2019   Lab Results  Component Value Date   LDLCALC 125 (H) 11/26/2019   Lab Results  Component Value Date   TRIG 132 11/26/2019   Lab Results  Component Value Date   CHOLHDL 5.5 (H) 11/26/2019   No results found for: LDLDIRECT Glucose:  Glucose, Bld  Date Value Ref Range Status  11/26/2019 93 65 - 99 mg/dL Final    Comment:    .            Fasting reference interval .   04/27/2016 88 65 - 99 mg/dL Final    Flowsheet Row Office Visit from 09/22/2020 in  [facility]  AUDIT-C Score 0     Married STD testing and prevention (HIV/chl/gon/syphilis): 11/26/19 Hep C: 11/26/19  Skin cancer: Discussed monitoring for atypical lesions Colorectal cancer: discussed new guideline  Prostate cancer: N/A  Lung cancer:  Low Dose CT Chest recommended if Age 49-80 years, 30 pack-year currently smoking OR have quit w/in 15years. Patient does not qualify.   ECG:  N/A  Vaccines:   Pneumonia: educated and discussed with patient. Flu: refused  Advanced Care Planning: A voluntary discussion about advance care planning including the explanation and discussion of advance directives.  Discussed health care proxy and Living will, and the patient was able to identify a health care proxy as wife  Patient Active Problem List   Diagnosis Date Noted  . Chronic reflux esophagitis 10/05/2016  . Gastritis 10/05/2016  . Night sweats 04/27/2016    Past Surgical History:  Procedure Laterality Date  . COLONOSCOPY    . ESOPHAGOGASTRODUODENOSCOPY    . ESOPHAGOGASTRODUODENOSCOPY (EGD) WITH PROPOFOL N/A 11/07/2016   Procedure:  ESOPHAGOGASTRODUODENOSCOPY (EGD) WITH PROPOFOL;  Surgeon: Christena Deem, MD;  Location: [facility] ENDOSCOPY;  Service: Endoscopy;  Laterality: N/A;    Family History  Problem Relation Age of Onset  . Hypertension Father   . Diabetes Father   . Stroke Father   . Cancer Other     Social History   Socioeconomic History  . Marital status: Married    Spouse name: Not on file  . Number of children: 3  . Years of education: Not on file  . Highest education level: 12th grade  Occupational History  . Occupation: driver - trucks     Comment: [employer]  Tobacco Use  . Smoking status: Never Smoker  . Smokeless tobacco: Never Used  Vaping Use  . Vaping Use: Never used  Substance and Sexual Activity  . Alcohol use: No  . Drug use: No  . Sexual activity: Yes    Partners: Female    Comment: wife had hysterectomy   Other Topics Concern  . Not on file  Social History Narrative  . Not on file   Social Determinants of Health   Financial Resource Strain: Low Risk   . Difficulty of Paying Living Expenses: Not hard at all  Food Insecurity: No Food Insecurity  . Worried About Programme researcher, broadcasting/film/video in the Last Year: Never true  . Ran Out of Food in the Last Year: Never true  Transportation Needs: No Transportation Needs  . Lack of Transportation (Medical): No  . Lack of Transportation (Non-Medical): No  Physical Activity: Insufficiently Active  . Days of Exercise per Week: 2 days  . Minutes of Exercise per Session: 20 min  Stress: No Stress Concern Present  . Feeling of Stress : Not at all  Social Connections: Moderately Integrated  . Frequency of Communication with Friends and Family: More than three times a week  . Frequency of Social Gatherings with Friends and Family: Twice a week  . Attends Religious Services: More than 4 times per year  . Active Member of Clubs or Organizations: No  . Attends Banker Meetings: Never  . Marital Status: Married  Catering manager Violence:  Not At Risk  . Fear of Current or Ex-Partner: No  . Emotionally Abused: No  . Physically Abused: No  . Sexually Abused: No     Current Outpatient Medications:  .  albuterol (VENTOLIN HFA) 108 (90 Base) MCG/ACT inhaler, Inhale 2 puffs into the lungs every  6 (six) hours as needed for wheezing or shortness of breath., Disp: 8 g, Rfl: 0 .  benzonatate (TESSALON) 100 MG capsule, Take 1-2 capsules (100-200 mg total) by mouth 3 (three) times daily as needed for cough., Disp: 60 capsule, Rfl: 0 .  Cholecalciferol (VITAMIN D) 50 MCG (2000 UT) CAPS, Take 1 capsule (2,000 Units total) by mouth daily., Disp: 30 capsule, Rfl: 0 .  ibuprofen (ADVIL) 800 MG tablet, Take 1 tablet (800 mg total) by mouth every 8 (eight) hours as needed., Disp: 30 tablet, Rfl: 1 .  ketorolac (TORADOL) 10 MG tablet, Take 1 tablet (10 mg total) by mouth every 6 (six) hours as needed., Disp: 20 tablet, Rfl: 0 .  pantoprazole (PROTONIX) 20 MG tablet, Take 1 tablet (20 mg total) by mouth daily., Disp: 90 tablet, Rfl: 1 .  tadalafil (CIALIS) 20 MG tablet, Take 0.5-1 tablets (10-20 mg total) by mouth every other day as needed for erectile dysfunction., Disp: 30 tablet, Rfl: 1  No Known Allergies   ROS  Constitutional: Negative for fever or weight change.  Respiratory: Negative for cough and shortness of breath.   Cardiovascular: Negative for chest pain or palpitations.  Gastrointestinal: Negative for abdominal pain, no bowel changes.  Musculoskeletal: Negative for gait problem or joint swelling.  Skin: Negative for rash.  Neurological: Negative for dizziness or headache.  No other specific complaints in a complete review of systems (except as listed in HPI above).   Objective  Vitals:   11/30/20 0927  BP: 122/70  Pulse: 86  Resp: 16  Temp: 98.4 F (36.9 C)  TempSrc: Oral  SpO2: 98%  Weight: 220 lb (99.8 kg)  Height: 6' (1.829 m)    Body mass index is 29.84 kg/m.  Physical Exam  Constitutional: Patient  appears well-developed and well-nourished. Muscular . No distress.  HENT: Head: Normocephalic and atraumatic. Ears: B TMs ok, no erythema or effusion; Nose: Nose normal. Mouth/Throat: Oropharynx is clear and moist. No oropharyngeal exudate.  Eyes: Conjunctivae and EOM are normal. Pupils are equal, round, and reactive to light. No scleral icterus.  Neck: Normal range of motion. Neck supple. No JVD present. No thyromegaly present.  Cardiovascular: Normal rate, regular rhythm and normal heart sounds.  No murmur heard. No BLE edema. Pulmonary/Chest: Effort normal and breath sounds normal. No respiratory distress. Abdominal: Soft. Bowel sounds are normal, no distension. There is no tenderness. no masses MALE GENITALIA: Normal descended testes bilaterally, no masses palpated, no hernias, no lesions, no discharge RECTAL: not done  Musculoskeletal: Normal range of motion, no joint effusions. No gross deformities Neurological: he is alert and oriented to person, place, and time. No cranial nerve deficit. Coordination, balance, strength, speech and gait are normal.  Skin: Skin is warm and dry. No rash noted. No erythema.  Psychiatric: Patient has a normal mood and affect. behavior is normal. Judgment and thought content normal.   Fall Risk: Fall Risk  11/30/2020 09/22/2020 03/10/2020 11/26/2019 10/20/2017  Falls in the past year? 0 0 0 0 No  Number falls in past yr: 0 0 0 0 -  Injury with Fall? 0 0 0 0 -  Follow up - - - Falls evaluation completed -    Functional Status Survey: Is the patient deaf or have difficulty hearing?: No Does the patient have difficulty seeing, even when wearing glasses/contacts?: No Does the patient have difficulty concentrating, remembering, or making decisions?: No Does the patient have difficulty walking or climbing stairs?: No Does the patient have difficulty  dressing or bathing?: No Does the patient have difficulty doing errands alone such as visiting a doctor's office or  shopping?: No   Assessment & Plan  1. Well adult exam  - Lipid panel - Hemoglobin A1c - Uric acid - COMPLETE METABOLIC PANEL WITH GFR  2. Screening for colon cancer  - Ambulatory referral to Gastroenterology  3. Pre-diabetes  - Hemoglobin A1c  4. Elevated uric acid in blood  - Uric acid  5. Dyslipidemia  - Lipid panel  6. Gastroesophageal reflux disease, unspecified whether esophagitis present  - Ambulatory referral to Gastroenterology   -Prostate cancer screening and PSA options (with potential risks and benefits of testing vs not testing) were discussed along with recent recs/guidelines. -USPSTF grade A and B recommendations reviewed with patient; age-appropriate recommendations, preventive care, screening tests, etc discussed and encouraged; healthy living encouraged; see AVS for patient education given to patient -Discussed importance of 150 minutes of physical activity weekly, eat two servings of fish weekly, eat one serving of tree nuts ( cashews, pistachios, pecans, almonds.Marland Kitchen) every other day, eat 6 servings of fruit/vegetables daily and drink plenty of water and avoid sweet beverages.

## 2020-12-01 LAB — HEMOGLOBIN A1C
Hgb A1c MFr Bld: 5.8 % of total Hgb — ABNORMAL HIGH (ref <5.7)
Mean Plasma Glucose: 120 mg/dL
eAG (mmol/L): 6.6 mmol/L

## 2020-12-01 LAB — LIPID PANEL
Cholesterol: 154 mg/dL (ref <200)
HDL: 31 mg/dL — ABNORMAL LOW (ref >=40)
LDL Cholesterol (Calc): 101 mg/dL (calc) — ABNORMAL HIGH
Non-HDL Cholesterol (Calc): 123 mg/dL (calc) (ref <130)
Total CHOL/HDL Ratio: 5 (calc) — ABNORMAL HIGH (ref <5.0)
Triglycerides: 119 mg/dL (ref <150)

## 2020-12-01 LAB — COMPLETE METABOLIC PANEL WITH GFR
AG Ratio: 1.5 (calc) (ref 1.0–2.5)
ALT: 21 U/L (ref 9–46)
AST: 21 U/L (ref 10–40)
Albumin: 4.4 g/dL (ref 3.6–5.1)
Alkaline phosphatase (APISO): 34 U/L — ABNORMAL LOW (ref 36–130)
BUN: 18 mg/dL (ref 7–25)
CO2: 27 mmol/L (ref 20–32)
Calcium: 9.7 mg/dL (ref 8.6–10.3)
Chloride: 105 mmol/L (ref 98–110)
Creat: 1.21 mg/dL (ref 0.60–1.35)
GFR, Est African American: 82 mL/min/{1.73_m2} (ref >=60)
GFR, Est Non African American: 70 mL/min/{1.73_m2} (ref >=60)
Globulin: 2.9 g/dL (calc) (ref 1.9–3.7)
Glucose, Bld: 89 mg/dL (ref 65–99)
Potassium: 4.2 mmol/L (ref 3.5–5.3)
Sodium: 139 mmol/L (ref 135–146)
Total Bilirubin: 0.4 mg/dL (ref 0.2–1.2)
Total Protein: 7.3 g/dL (ref 6.1–8.1)

## 2020-12-01 LAB — URIC ACID: Uric Acid, Serum: 8.4 mg/dL — ABNORMAL HIGH (ref 4.0–8.0)

## 2021-07-15 ENCOUNTER — Telehealth: Payer: Self-pay

## 2021-07-15 ENCOUNTER — Other Ambulatory Visit: Payer: Self-pay

## 2021-07-15 DIAGNOSIS — N529 Male erectile dysfunction, unspecified: Secondary | ICD-10-CM

## 2021-07-15 NOTE — Telephone Encounter (Signed)
Pt requesting a refill on cialis. Please send to [facility]. Pt has scheduled his physical for next yr.

## 2021-07-15 NOTE — Telephone Encounter (Signed)
Patient called in wanted to know  why he needs to schedule a fu appt when he already had a physical.

## 2021-07-15 NOTE — Telephone Encounter (Signed)
Lvm for pt to schedule a follow up appointment.

## 2021-07-16 NOTE — Telephone Encounter (Signed)
Appt scheduled with Dr Carlynn Purl for Monday 12.5.2022

## 2021-07-19 ENCOUNTER — Encounter: Payer: Self-pay | Admitting: Family Medicine

## 2021-07-19 ENCOUNTER — Ambulatory Visit (INDEPENDENT_AMBULATORY_CARE_PROVIDER_SITE_OTHER): Payer: BC Managed Care – PPO | Admitting: Family Medicine

## 2021-07-19 ENCOUNTER — Other Ambulatory Visit: Payer: Self-pay

## 2021-07-19 VITALS — BP 132/82 | HR 76 | Temp 98.0°F | Resp 16 | Ht 68.0 in | Wt 223.0 lb

## 2021-07-19 DIAGNOSIS — E785 Hyperlipidemia, unspecified: Secondary | ICD-10-CM | POA: Insufficient documentation

## 2021-07-19 DIAGNOSIS — N528 Other male erectile dysfunction: Secondary | ICD-10-CM | POA: Diagnosis not present

## 2021-07-19 DIAGNOSIS — K219 Gastro-esophageal reflux disease without esophagitis: Secondary | ICD-10-CM

## 2021-07-19 DIAGNOSIS — R7303 Prediabetes: Secondary | ICD-10-CM | POA: Diagnosis not present

## 2021-07-19 DIAGNOSIS — M25571 Pain in right ankle and joints of right foot: Secondary | ICD-10-CM

## 2021-07-19 DIAGNOSIS — E790 Hyperuricemia without signs of inflammatory arthritis and tophaceous disease: Secondary | ICD-10-CM

## 2021-07-19 DIAGNOSIS — G8929 Other chronic pain: Secondary | ICD-10-CM

## 2021-07-19 MED ORDER — TADALAFIL 20 MG PO TABS
20.0000 mg | ORAL_TABLET | ORAL | 0 refills | Status: DC | PRN
Start: 2021-07-19 — End: 2021-12-01

## 2021-07-19 NOTE — Progress Notes (Signed)
Name: Dustin Brady   MRN: [id number]    DOB: [birth year]   Date:07/19/2021       Progress Note  Subjective  Chief Complaint  Medication Refill  HPI  Pre-diabetes: A1C was 5.8 % , denies polyphagia, polydipsia or polyuria.   Dyslipidemia: low HDL, explained need to exercise more, eat more fish and tree nuts  Chronic right ankle pain: he was seen by Dr. Allena Katz last year, diagnosed with posterior tendinitis and peroneal tendinitis right leg and was advised to have surgery but he is feeling better with insoles and is postponing until unable to function   ED: he takes Cialis prn, he states it helps achieve and maintain and erection. He does not have high LDL, PVD or diabetes, HTN. Explained he may have more psychological difficulties causing erection dysfunction.    GERD: sees GI, taking pantoprazole prn only, symptoms triggered by diet.   Patient Active Problem List   Diagnosis Date Noted   Other male erectile dysfunction 07/19/2021   Dyslipidemia 07/19/2021   Pre-diabetes 07/19/2021   Elevated uric acid in blood 07/19/2021   Chronic reflux esophagitis 10/05/2016    Past Surgical History:  Procedure Laterality Date   COLONOSCOPY     ESOPHAGOGASTRODUODENOSCOPY     ESOPHAGOGASTRODUODENOSCOPY (EGD) WITH PROPOFOL N/A 11/07/2016   Procedure: ESOPHAGOGASTRODUODENOSCOPY (EGD) WITH PROPOFOL;  Surgeon: Christena Deem, MD;  Location: [facility] ENDOSCOPY;  Service: Endoscopy;  Laterality: N/A;    Family History  Problem Relation Age of Onset   Hypertension Father    Diabetes Father    Stroke Father    Cancer Other     Social History   Tobacco Use   Smoking status: Never   Smokeless tobacco: Never  Substance Use Topics   Alcohol use: No     Current Outpatient Medications:    Cholecalciferol (VITAMIN D) 50 MCG (2000 UT) CAPS, Take 1 capsule (2,000 Units total) by mouth daily., Disp: 30 capsule, Rfl: 0   pantoprazole (PROTONIX) 20 MG tablet, Take 1 tablet (20 mg total) by mouth  daily., Disp: 90 tablet, Rfl: 1   tadalafil (CIALIS) 20 MG tablet, Take 1 tablet (20 mg total) by mouth every other day as needed for erectile dysfunction., Disp: 90 tablet, Rfl: 0  No Known Allergies  I personally reviewed active problem list, medication list, allergies, family history, social history, health maintenance with the patient/caregiver today.   ROS  Constitutional: Negative for fever or weight change.  Respiratory: Negative for cough and shortness of breath.   Cardiovascular: Negative for chest pain or palpitations.  Gastrointestinal: Negative for abdominal pain, no bowel changes.  Musculoskeletal: Negative for gait problem or joint swelling.  Skin: Negative for rash.  Neurological: Negative for dizziness or headache.  No other specific complaints in a complete review of systems (except as listed in HPI above).   Objective  Vitals:   07/19/21 1456  BP: 132/82  Pulse: 76  Resp: 16  Temp: 98 F (36.7 C)  SpO2: 98%  Weight: 223 lb (101.2 kg)  Height: 5\' 8"  (1.727 m)    Body mass index is 33.91 kg/m.  Physical Exam  Constitutional: Patient appears well-developed and well-nourished. Obese  No distress.  HEENT: head atraumatic, normocephalic, pupils equal and reactive to light, neck supple Cardiovascular: Normal rate, regular rhythm and normal heart sounds.  No murmur heard. No BLE edema. Pulmonary/Chest: Effort normal and breath sounds normal. No respiratory distress. Abdominal: Soft.  There is no tenderness. Psychiatric: Patient has a normal mood and  affect. behavior is normal. Judgment and thought content normal.    PHQ2/9: Depression screen [facility] 2/9 07/19/2021 11/30/2020 09/22/2020 03/10/2020 11/26/2019  Decreased Interest 0 0 0 0 0  Down, Depressed, Hopeless 0 0 0 0 0  PHQ - 2 Score 0 0 0 0 0  Altered sleeping 0 - 0 0 0  Tired, decreased energy 0 - 0 0 0  Change in appetite 0 - 0 0 0  Feeling bad or failure about yourself  0 - 0 0 0  Trouble concentrating 0  - 0 0 0  Moving slowly or fidgety/restless 0 - 0 0 0  Suicidal thoughts 0 - 0 0 0  PHQ-9 Score 0 - 0 0 0  Difficult doing work/chores - - Not difficult at all Not difficult at all Not difficult at all    phq 9 is negative   Fall Risk: Fall Risk  07/19/2021 11/30/2020 09/22/2020 03/10/2020 11/26/2019  Falls in the past year? 0 0 0 0 0  Number falls in past yr: 0 0 0 0 0  Injury with Fall? 0 0 0 0 0  Risk for fall due to : No Fall Risks - - - -  Follow up Falls prevention discussed - - - Falls evaluation completed      Functional Status Survey: Is the patient deaf or have difficulty hearing?: No Does the patient have difficulty seeing, even when wearing glasses/contacts?: No Does the patient have difficulty concentrating, remembering, or making decisions?: No Does the patient have difficulty walking or climbing stairs?: No Does the patient have difficulty dressing or bathing?: No Does the patient have difficulty doing errands alone such as visiting a doctor's office or shopping?: No    Assessment & Plan  1. Pre-diabetes   2. Dyslipidemia   3. Other male erectile dysfunction  - tadalafil (CIALIS) 20 MG tablet; Take 1 tablet (20 mg total) by mouth every other day as needed for erectile dysfunction.  Dispense: 90 tablet; Refill: 0  4. Gastroesophageal reflux disease, unspecified whether esophagitis present   5. Elevated uric acid in blood   6. Chronic pain of right ankle

## 2021-11-30 NOTE — Progress Notes (Signed)
Name: Dustin Brady   MRN: [id number]    DOB: Dec 24, 1970   Date:12/01/2021 ? ?     Progress Note ? ?Subjective ? ?Chief Complaint ? ?Annual Exam ? ?HPI ? ?Patient presents for annual CPE  ? ?IPSS Questionnaire (AUA-7): ?Over the past month?   ?1)  How often have you had a sensation of not emptying your bladder completely after you finish urinating?  0 - Not at all  ?2)  How often have you had to urinate again less than two hours after you finished urinating? 0 - Not at all  ?3)  How often have you found you stopped and started again several times when you urinated?  0 - Not at all  ?4) How difficult have you found it to postpone urination?  0 - Not at all  ?5) How often have you had a weak urinary stream?  0 - Not at all  ?6) How often have you had to push or strain to begin urination?  0 - Not at all  ?7) How many times did you most typically get up to urinate from the time you went to bed until the time you got up in the morning?  1 - 1 time  ?Total score:  0-7 mildly symptomatic  ? 8-19 moderately symptomatic  ? 20-35 severely symptomatic  ?  ? ?Diet: discussed healthy diet  ?Exercise: continue regular physical activity  ? ?Depression: phq 9 is negative ? ?  12/01/2021  ? 11:04 AM 07/19/2021  ?  2:55 PM 11/30/2020  ?  9:19 AM 09/22/2020  ? 10:36 AM 03/10/2020  ?  9:39 AM  ?Depression screen PHQ 2/9  ?Decreased Interest 0 0 0 0 0  ?Down, Depressed, Hopeless 0 0 0 0 0  ?PHQ - 2 Score 0 0 0 0 0  ?Altered sleeping 0 0  0 0  ?Tired, decreased energy 0 0  0 0  ?Change in appetite 0 0  0 0  ?Feeling bad or failure about yourself  0 0  0 0  ?Trouble concentrating 0 0  0 0  ?Moving slowly or fidgety/restless 0 0  0 0  ?Suicidal thoughts 0 0  0 0  ?PHQ-9 Score 0 0  0 0  ?Difficult doing work/chores    Not difficult at all Not difficult at all  ? ? ?Hypertension:  ?BP Readings from Last 3 Encounters:  ?12/01/21 128/74  ?07/19/21 132/82  ?11/30/20 122/70  ? ? ?Obesity: ?Wt Readings from Last 3 Encounters:  ?12/01/21 215 lb (97.5 kg)   ?07/19/21 223 lb (101.2 kg)  ?11/30/20 220 lb (99.8 kg)  ? ?BMI Readings from Last 3 Encounters:  ?12/01/21 32.69 kg/m?  ?07/19/21 33.91 kg/m?  ?11/30/20 29.84 kg/m?  ?  ? ?Lipids:  ?Lab Results  ?Component Value Date  ? CHOL 154 11/30/2020  ? CHOL 183 11/26/2019  ? ?Lab Results  ?Component Value Date  ? HDL 31 (L) 11/30/2020  ? HDL 33 (L) 11/26/2019  ? ?Lab Results  ?Component Value Date  ? LDLCALC 101 (H) 11/30/2020  ? LDLCALC 125 (H) 11/26/2019  ? ?Lab Results  ?Component Value Date  ? TRIG 119 11/30/2020  ? TRIG 132 11/26/2019  ? ?Lab Results  ?Component Value Date  ? CHOLHDL 5.0 (H) 11/30/2020  ? CHOLHDL 5.5 (H) 11/26/2019  ? ?No results found for: LDLDIRECT ?Glucose:  ?Glucose, Bld  ?Date Value Ref Range Status  ?11/30/2020 89 65 - 99 mg/dL Final  ?  Comment:  ?  Marland Kitchen ?  Fasting reference interval ?. ?  ?11/26/2019 93 65 - 99 mg/dL Final  ?  Comment:  ?  . ?           Fasting reference interval ?. ?  ?04/27/2016 88 65 - 99 mg/dL Final  ? ? ?Flowsheet Row Office Visit from 12/01/2021 in [facility]  ?AUDIT-C Score 1  ? ?  ? ? ?Married ?STD testing and prevention (HIV/chl/gon/syphilis):  11/26/19 ?Sexual history: one sexual partner/wife  ?Hep C Screening: 11/26/19 ?Skin cancer: Discussed monitoring for atypical lesions ?Colorectal cancer: last one done 06/2016 under the care of [facility] ?Prostate cancer:  N/A ? ? ? ?Lung cancer:  Low Dose CT Chest recommended if Age 34-80 years, 30 pack-year currently smoking OR have quit w/in 15years. Patient  no a candidate for screening   ?AAA: The USPSTF recommends one-time screening with ultrasonography in men ages 50 to 75 years who have ever smoked. Patient   no, a candidate for screening  ?ECG:  N/A ? ?Vaccines:  ? ?HPV: N/A ?Tdap: up to date ?Shingrix: N/A ?Pneumonia: N/A ?Flu: Never ?COVID-19: discussed Bivalent booster  ? ?Advanced Care Planning: A voluntary discussion about advance care planning including the explanation and  discussion of advance directives.  Discussed health care proxy and Living will, and the patient was able to identify a health care proxy as wife.  Patient does not have a living will and power of attorney of health care  ? ?Patient Active Problem List  ? Diagnosis Date Noted  ? Other male erectile dysfunction 07/19/2021  ? Dyslipidemia 07/19/2021  ? Pre-diabetes 07/19/2021  ? Elevated uric acid in blood 07/19/2021  ? Chronic reflux esophagitis 10/05/2016  ? ? ?Past Surgical History:  ?Procedure Laterality Date  ? COLONOSCOPY    ? ESOPHAGOGASTRODUODENOSCOPY    ? ESOPHAGOGASTRODUODENOSCOPY (EGD) WITH PROPOFOL N/A 11/07/2016  ? Procedure: ESOPHAGOGASTRODUODENOSCOPY (EGD) WITH PROPOFOL;  Surgeon: Christena Deem, MD;  Location: [facility] ENDOSCOPY;  Service: Endoscopy;  Laterality: N/A;  ? ? ?Family History  ?Problem Relation Age of Onset  ? Hypertension Father   ? Diabetes Father   ? Stroke Father   ? Cancer Other   ? ? ?Social History  ? ?Socioeconomic History  ? Marital status: Married  ?  Spouse name: Not on file  ? Number of children: 3  ? Years of education: Not on file  ? Highest education level: 12th grade  ?Occupational History  ? Occupation: driver - trucks   ?  Comment: [employer]  ?Tobacco Use  ? Smoking status: Never  ? Smokeless tobacco: Never  ?Vaping Use  ? Vaping Use: Never used  ?Substance and Sexual Activity  ? Alcohol use: No  ? Drug use: No  ? Sexual activity: Yes  ?  Partners: Female  ?  Comment: wife had hysterectomy   ?Other Topics Concern  ? Not on file  ?Social History Narrative  ? Not on file  ? ?Social Determinants of Health  ? ?Financial Resource Strain: Low Risk   ? Difficulty of Paying Living Expenses: Not hard at all  ?Food Insecurity: No Food Insecurity  ? Worried About Programme researcher, broadcasting/film/video in the Last Year: Never true  ? Ran Out of Food in the Last Year: Never true  ?Transportation Needs: No Transportation Needs  ? Lack of Transportation (Medical): No  ? Lack of Transportation (Non-Medical): No   ?Physical Activity: Sufficiently Active  ? Days of Exercise per Week: 5 days  ? Minutes  of Exercise per Session: 60 min  ?Stress: No Stress Concern Present  ? Feeling of Stress : Not at all  ?Social Connections: Moderately Integrated  ? Frequency of Communication with Friends and Family: More than three times a week  ? Frequency of Social Gatherings with Friends and Family: Once a week  ? Attends Religious Services: More than 4 times per year  ? Active Member of Clubs or Organizations: No  ? Attends BankerClub or Organization Meetings: Never  ? Marital Status: Married  ?Intimate Partner Violence: Not At Risk  ? Fear of Current or Ex-Partner: No  ? Emotionally Abused: No  ? Physically Abused: No  ? Sexually Abused: No  ? ? ? ?Current Outpatient Medications:  ?  Cholecalciferol (VITAMIN D) 50 MCG (2000 UT) CAPS, Take 1 capsule (2,000 Units total) by mouth daily., Disp: 30 capsule, Rfl: 0 ?  pantoprazole (PROTONIX) 20 MG tablet, Take 1 tablet (20 mg total) by mouth daily., Disp: 90 tablet, Rfl: 1 ?  tadalafil (CIALIS) 20 MG tablet, Take 1 tablet (20 mg total) by mouth every other day as needed for erectile dysfunction., Disp: 90 tablet, Rfl: 0 ? ?No Known Allergies ? ? ?ROS ? ?Constitutional: Negative for fever , positive for weight change.  ?Respiratory: Negative for cough and shortness of breath.   ?Cardiovascular: Negative for chest pain or palpitations.  ?Gastrointestinal: Negative for abdominal pain, no bowel changes.  ?Musculoskeletal: Negative for gait problem or joint swelling.  ?Skin: Negative for rash.  ?Neurological: Negative for dizziness or headache.  ?No other specific complaints in a complete review of systems (except as listed in HPI above).  ? ? ?Objective ? ?Vitals:  ? 12/01/21 1104  ?BP: 128/74  ?Pulse: 73  ?Resp: 16  ?SpO2: 98%  ?Weight: 215 lb (97.5 kg)  ?Height: 5\' 8"  (1.727 m)  ? ? ?Body mass index is 32.69 kg/m?. ? ?Physical Exam ? ?Constitutional: Patient appears well-developed and obese . No  distress.  ?HENT: Head: Normocephalic and atraumatic. Ears: B TMs ok, no erythema or effusion; Nose: Nose normal. Mouth/Throat: Oropharynx is clear and moist. No oropharyngeal exudate.  ?Eyes: Conjunctivae and EOM are

## 2021-11-30 NOTE — Patient Instructions (Signed)

## 2021-12-01 ENCOUNTER — Ambulatory Visit (INDEPENDENT_AMBULATORY_CARE_PROVIDER_SITE_OTHER): Payer: BC Managed Care – PPO | Admitting: Family Medicine

## 2021-12-01 ENCOUNTER — Encounter: Payer: Self-pay | Admitting: Family Medicine

## 2021-12-01 VITALS — BP 128/74 | HR 73 | Resp 16 | Ht 68.0 in | Wt 215.0 lb

## 2021-12-01 DIAGNOSIS — E785 Hyperlipidemia, unspecified: Secondary | ICD-10-CM

## 2021-12-01 DIAGNOSIS — K219 Gastro-esophageal reflux disease without esophagitis: Secondary | ICD-10-CM

## 2021-12-01 DIAGNOSIS — Z0000 Encounter for general adult medical examination without abnormal findings: Secondary | ICD-10-CM | POA: Diagnosis not present

## 2021-12-01 DIAGNOSIS — N528 Other male erectile dysfunction: Secondary | ICD-10-CM

## 2021-12-01 DIAGNOSIS — Z1211 Encounter for screening for malignant neoplasm of colon: Secondary | ICD-10-CM

## 2021-12-01 DIAGNOSIS — R7303 Prediabetes: Secondary | ICD-10-CM

## 2021-12-01 DIAGNOSIS — E790 Hyperuricemia without signs of inflammatory arthritis and tophaceous disease: Secondary | ICD-10-CM

## 2021-12-01 MED ORDER — TADALAFIL 20 MG PO TABS: 20.0000 mg | ORAL_TABLET | ORAL | 0 refills | Status: DC | PRN

## 2021-12-02 ENCOUNTER — Telehealth: Payer: Self-pay

## 2021-12-02 LAB — COMPLETE METABOLIC PANEL WITH GFR
AG Ratio: 1.8 (calc) (ref 1.0–2.5)
ALT: 24 U/L (ref 9–46)
AST: 20 U/L (ref 10–40)
Albumin: 4.7 g/dL (ref 3.6–5.1)
Alkaline phosphatase (APISO): 36 U/L (ref 36–130)
BUN: 14 mg/dL (ref 7–25)
CO2: 26 mmol/L (ref 20–32)
Calcium: 9.8 mg/dL (ref 8.6–10.3)
Chloride: 104 mmol/L (ref 98–110)
Creat: 1.15 mg/dL (ref 0.60–1.29)
Globulin: 2.6 g/dL (calc) (ref 1.9–3.7)
Glucose, Bld: 90 mg/dL (ref 65–99)
Potassium: 4.5 mmol/L (ref 3.5–5.3)
Sodium: 139 mmol/L (ref 135–146)
Total Bilirubin: 0.4 mg/dL (ref 0.2–1.2)
Total Protein: 7.3 g/dL (ref 6.1–8.1)
eGFR: 78 mL/min/{1.73_m2} (ref >=60)

## 2021-12-02 LAB — HEMOGLOBIN A1C
Hgb A1c MFr Bld: 5.9 % of total Hgb — ABNORMAL HIGH (ref <5.7)
Mean Plasma Glucose: 123 mg/dL
eAG (mmol/L): 6.8 mmol/L

## 2021-12-02 LAB — CBC WITH DIFFERENTIAL/PLATELET
Absolute Monocytes: 442 cells/uL (ref 200–950)
Basophils Absolute: 40 cells/uL (ref 0–200)
Basophils Relative: 0.6 %
Eosinophils Absolute: 201 cells/uL (ref 15–500)
Eosinophils Relative: 3 %
HCT: 43.9 % (ref 38.5–50.0)
Hemoglobin: 14.7 g/dL (ref 13.2–17.1)
Lymphs Abs: 2814 cells/uL (ref 850–3900)
MCH: 30.9 pg (ref 27.0–33.0)
MCHC: 33.5 g/dL (ref 32.0–36.0)
MCV: 92.4 fL (ref 80.0–100.0)
MPV: 11.6 fL (ref 7.5–12.5)
Monocytes Relative: 6.6 %
Neutro Abs: 3203 cells/uL (ref 1500–7800)
Neutrophils Relative %: 47.8 %
Platelets: 239 10*3/uL (ref 140–400)
RBC: 4.75 10*6/uL (ref 4.20–5.80)
RDW: 13.1 % (ref 11.0–15.0)
Total Lymphocyte: 42 %
WBC: 6.7 10*3/uL (ref 3.8–10.8)

## 2021-12-02 LAB — URIC ACID: Uric Acid, Serum: 7.2 mg/dL (ref 4.0–8.0)

## 2021-12-02 LAB — LIPID PANEL
Cholesterol: 175 mg/dL (ref <200)
HDL: 38 mg/dL — ABNORMAL LOW (ref >=40)
LDL Cholesterol (Calc): 118 mg/dL (calc) — ABNORMAL HIGH
Non-HDL Cholesterol (Calc): 137 mg/dL (calc) — ABNORMAL HIGH (ref <130)
Total CHOL/HDL Ratio: 4.6 (calc) (ref <5.0)
Triglycerides: 90 mg/dL (ref <150)

## 2021-12-02 NOTE — Telephone Encounter (Signed)
Contacted pt to schedule colonoscopy.  Upon speaking with patient noticed that Ginger our Engineer, manufacturing requested insurance information.  Based on chart review he is a patient of Christine London's at [facility].  His last visit with her was 02/09/21.  He has been asked to contact [facility] to discuss when his next colonoscopy should be scheduled.  He believes it was performed by them in 2018. ? ?Thanks, ?Marcelino Duster, CMA ?

## 2021-12-03 ENCOUNTER — Other Ambulatory Visit: Payer: Self-pay | Admitting: Family Medicine

## 2021-12-03 DIAGNOSIS — N528 Other male erectile dysfunction: Secondary | ICD-10-CM

## 2022-06-15 ENCOUNTER — Other Ambulatory Visit: Payer: Self-pay | Admitting: Family Medicine

## 2022-06-15 DIAGNOSIS — N528 Other male erectile dysfunction: Secondary | ICD-10-CM

## 2022-06-15 NOTE — Telephone Encounter (Signed)
Lvm to return call to schedule appt 13m from last visit

## 2022-07-28 ENCOUNTER — Encounter: Payer: Self-pay | Admitting: Nurse Practitioner

## 2022-07-28 ENCOUNTER — Other Ambulatory Visit: Payer: Self-pay

## 2022-07-28 ENCOUNTER — Telehealth (INDEPENDENT_AMBULATORY_CARE_PROVIDER_SITE_OTHER): Payer: BC Managed Care – PPO | Admitting: Nurse Practitioner

## 2022-07-28 DIAGNOSIS — U071 COVID-19: Secondary | ICD-10-CM

## 2022-07-28 MED ORDER — NIRMATRELVIR/RITONAVIR (PAXLOVID)TABLET: 3.0000 | ORAL_TABLET | Freq: Two times a day (BID) | ORAL | 0 refills | Status: AC

## 2022-07-28 MED ORDER — BENZONATATE 100 MG PO CAPS: 200.0000 mg | ORAL_CAPSULE | Freq: Two times a day (BID) | ORAL | 0 refills | Status: DC | PRN

## 2022-07-28 MED ORDER — FLUTICASONE PROPIONATE 50 MCG/ACT NA SUSP: 2.0000 | Freq: Every day | NASAL | 0 refills | Status: DC

## 2022-07-28 MED ORDER — CETIRIZINE HCL 10 MG PO TABS: 10.0000 mg | ORAL_TABLET | Freq: Every day | ORAL | 0 refills | Status: DC

## 2022-07-28 NOTE — Progress Notes (Signed)
Name: Dustin Brady   MRN: [id number]    DOB: [birth year]   Date:07/28/2022       Progress Note  Subjective  Chief Complaint  Chief Complaint  Patient presents with   Covid Positive    Cough, sore throat, nasal congestion, bodyaches, headaches for 5 days    I connected with  Shea Evans  on 07/28/22 at 09:50 am by a video enabled telemedicine application and verified that I am speaking with the correct person using two identifiers.  I discussed the limitations of evaluation and management by telemedicine and the availability of in person appointments. The patient expressed understanding and agreed to proceed with a virtual visit  Staff also discussed with the patient that there may be a patient responsible charge related to this service. Patient Location: home Provider Location: [facility] Additional Individuals present: alone  HPI  Covid 19: patient reports he tested positive for covid this morning. Symptoms started Sunday.  Symptoms include cough, sore throat, left ear pain, body aches, headache, and nasal congestion  patient denies any fever or shortness of breath. He says he has been taking mucinex.  Recommend taking vitamin c, vitamin d and zinc.  Can also take mucinex, flonase and zyrtec.  Push fluids and get rest.  Discussed CDC guidelines regarding quarantine. Discussed reasons to seek emergency care.   Patient Active Problem List   Diagnosis Date Noted   Other male erectile dysfunction 07/19/2021   Dyslipidemia 07/19/2021   Pre-diabetes 07/19/2021   Elevated uric acid in blood 07/19/2021   Chronic reflux esophagitis 10/05/2016    Social History   Tobacco Use   Smoking status: Never   Smokeless tobacco: Never  Substance Use Topics   Alcohol use: No     Current Outpatient Medications:    Cholecalciferol (VITAMIN D) 50 MCG (2000 UT) CAPS, Take 1 capsule (2,000 Units total) by mouth daily., Disp: 30 capsule, Rfl: 0   pantoprazole (PROTONIX) 20 MG tablet, Take 1 tablet (20  mg total) by mouth daily., Disp: 90 tablet, Rfl: 1   tadalafil (CIALIS) 20 MG tablet, Take 1 tablet (20 mg total) by mouth every other day as needed for erectile dysfunction., Disp: 90 tablet, Rfl: 0  No Known Allergies  I personally reviewed active problem list, medication list, allergies with the patient/caregiver today.  ROS  Constitutional: Negative for fever or weight change.  HEENT: positive sore throat, nasal congestion, left ear pain Respiratory: positive for cough and negative for  shortness of breath.   Cardiovascular: Negative for chest pain or palpitations.  Gastrointestinal: Negative for abdominal pain, no bowel changes.  Musculoskeletal: Negative for gait problem or joint swelling.  Skin: Negative for rash.  Neurological: Negative for dizziness, positive headache.  No other specific complaints in a complete review of systems (except as listed in HPI above).   Objective  Virtual encounter, vitals not obtained.  There is no height or weight on file to calculate BMI.  Nursing Note and Vital Signs reviewed.  Physical Exam  Awake, alert and oriented, speaking in complete sentences  No results found for this or any previous visit (from the past 72 hour(s)).  Assessment & Plan  1. COVID-19 Push fluids, get rest Take zyrtec, flonase, mucinex Can also take vitamin d, vitamin c and zinc - benzonatate (TESSALON) 100 MG capsule; Take 2 capsules (200 mg total) by mouth 2 (two) times daily as needed for cough.  Dispense: 20 capsule; Refill: 0 - nirmatrelvir/ritonavir EUA (PAXLOVID) 20 x 150 MG &  10 x 100MG  TABS; Take 3 tablets by mouth 2 (two) times daily for 5 days. (Take nirmatrelvir 150 mg two tablets twice daily for 5 days and ritonavir 100 mg one tablet twice daily for 5 days) Patient GFR is 78  Dispense: 30 tablet; Refill: 0 - fluticasone (FLONASE) 50 MCG/ACT nasal spray; Place 2 sprays into both nostrils daily.  Dispense: 16 g; Refill: 0 - cetirizine (ZYRTEC) 10 MG  tablet; Take 1 tablet (10 mg total) by mouth daily.  Dispense: 30 tablet; Refill: 0   -Red flags and when to present for emergency care or RTC including fever >101.75F, chest pain, shortness of breath, new/worsening/un-resolving symptoms,  reviewed with patient at time of visit. Follow up and care instructions discussed and provided in AVS. - I discussed the assessment and treatment plan with the patient. The patient was provided an opportunity to ask questions and all were answered. The patient agreed with the plan and demonstrated an understanding of the instructions.  I provided 15 minutes of non-face-to-face time during this encounter.  Bo Merino, FNP

## 2022-08-19 ENCOUNTER — Other Ambulatory Visit: Payer: Self-pay | Admitting: Nurse Practitioner

## 2022-08-19 DIAGNOSIS — U071 COVID-19: Secondary | ICD-10-CM

## 2022-08-19 NOTE — Telephone Encounter (Signed)
Lvm informing pt that prescriptions has been sent to pharmacy and for him to return call to sch his physical in April.

## 2022-08-19 NOTE — Telephone Encounter (Signed)
Requested medications are due for refill today.  yes  Requested medications are on the active medications list.  yes  Last refill. 07/28/2022 #30 0 rf  Future visit scheduled.   no  Notes to clinic.  Pharmacy needs dx code.    Requested Prescriptions  Pending Prescriptions Disp Refills   fluticasone (FLONASE) 50 MCG/ACT nasal spray [Pharmacy Med Name: FLUTICASONE PROP 50 MCG SPRAY] 48 mL 1    Sig: SPRAY 2 SPRAYS INTO EACH NOSTRIL EVERY DAY     Ear, Nose, and Throat: Nasal Preparations - Corticosteroids Passed - 08/19/2022  2:31 PM      Passed - Valid encounter within last 12 months    Recent Outpatient Visits           3 weeks ago [facility] Bo Merino, FNP   8 months ago Well adult exam   [facility] Steele Sizer, MD   1 year ago Pre-diabetes   [facility] Steele Sizer, MD   1 year ago Well adult exam   [facility] Steele Sizer, MD   1 year ago Acute bronchitis due to COVID-19 virus   [facility] Delsa Grana, [place]              Signed Prescriptions Disp Refills   cetirizine (ZYRTEC) 10 MG tablet 90 tablet 3    Sig: TAKE 1 TABLET BY MOUTH EVERY DAY     Ear, Nose, and Throat:  Antihistamines 2 Passed - 08/19/2022  2:31 PM      Passed - Cr in normal range and within 360 days    Creat  Date Value Ref Range Status  12/01/2021 1.15 0.60 - 1.29 mg/dL Final         Passed - Valid encounter within last 12 months    Recent Outpatient Visits           3 weeks ago [place], Julie F, FNP   8 months ago Well adult exam   [facility] Steele Sizer, MD   1 year ago Pre-diabetes   [facility] Steele Sizer, MD   1 year ago Well adult exam   [facility] Steele Sizer, MD   1 year ago Acute bronchitis due to COVID-19 virus   [facility] Delsa Grana, [place]

## 2022-08-19 NOTE — Telephone Encounter (Signed)
Requested Prescriptions  Pending Prescriptions Disp Refills   cetirizine (ZYRTEC) 10 MG tablet [Pharmacy Med Name: CETIRIZINE HCL 10 MG TABLET] 90 tablet 3    Sig: TAKE 1 TABLET BY MOUTH EVERY DAY     Ear, Nose, and Throat:  Antihistamines 2 Passed - 08/19/2022  2:31 PM      Passed - Cr in normal range and within 360 days    Creat  Date Value Ref Range Status  12/01/2021 1.15 0.60 - 1.29 mg/dL Final         Passed - Valid encounter within last 12 months    Recent Outpatient Visits           3 weeks ago [facility] Bo Merino, FNP   8 months ago Well adult exam   [facility] Steele Sizer, MD   1 year ago Pre-diabetes   [facility] Steele Sizer, MD   1 year ago Well adult exam   [facility] Steele Sizer, MD   1 year ago Acute bronchitis due to COVID-19 virus   [facility] Lucio Edward, Kristeen Miss, PA-C               fluticasone [facility]) 50 MCG/ACT nasal spray [Pharmacy Med Name: FLUTICASONE PROP 50 MCG SPRAY] 48 mL 1    Sig: SPRAY 2 SPRAYS INTO EACH NOSTRIL EVERY DAY     Ear, Nose, and Throat: Nasal Preparations - Corticosteroids Passed - 08/19/2022  2:31 PM      Passed - Valid encounter within last 12 months    Recent Outpatient Visits           3 weeks ago [facility] Bo Merino, FNP   8 months ago Well adult exam   [facility] Steele Sizer, MD   1 year ago Pre-diabetes   [facility] Steele Sizer, MD   1 year ago Well adult exam   [facility] Steele Sizer, MD   1 year ago Acute bronchitis due to COVID-19 virus   [facility] Delsa Grana, [place]

## 2022-10-31 ENCOUNTER — Encounter: Payer: Self-pay | Admitting: Nurse Practitioner

## 2022-11-01 ENCOUNTER — Other Ambulatory Visit: Payer: Self-pay | Admitting: Nurse Practitioner

## 2022-11-01 DIAGNOSIS — U071 COVID-19: Secondary | ICD-10-CM

## 2022-11-01 MED ORDER — CETIRIZINE HCL 10 MG PO TABS: 10.0000 mg | ORAL_TABLET | Freq: Every day | ORAL | 3 refills | Status: DC

## 2022-12-05 NOTE — Progress Notes (Unsigned)
Name: Dustin Brady   MRN: [id number]    DOB: 09/26/1970   Date:12/06/2022       Progress Note  Subjective  Chief Complaint  Annual Exam  HPI  Patient presents for annual CPE.  IPSS Questionnaire (AUA-7): Over the past month.   1)  How often have you had a sensation of not emptying your bladder completely after you finish urinating?  0 - Not at all  2)  How often have you had to urinate again less than two hours after you finished urinating? 0 - Not at all  3)  How often have you found you stopped and started again several times when you urinated?  0 - Not at all  4) How difficult have you found it to postpone urination?  0 - Not at all  5) How often have you had a weak urinary stream?  0 - Not at all  6) How often have you had to push or strain to begin urination?  0 - Not at all  7) How many times did you most typically get up to urinate from the time you went to bed until the time you got up in the morning?  1 - 1 time  Total score:  0-7 mildly symptomatic   8-19 moderately symptomatic   20-35 severely symptomatic     Diet: balanced diet, eating a lot at home  Exercise: continue regular physical activity  Last Dental Exam: up to date  Last Eye Exam: up to date  Depression: phq 9 is negative    12/06/2022    1:29 PM 07/28/2022    9:51 AM 12/01/2021   11:04 AM 07/19/2021    2:55 PM 11/30/2020    9:19 AM  Depression screen PHQ 2/9  Decreased Interest 0 0 0 0 0  Down, Depressed, Hopeless 0 0 0 0 0  PHQ - 2 Score 0 0 0 0 0  Altered sleeping 0  0 0   Tired, decreased energy 0  0 0   Change in appetite 0  0 0   Feeling bad or failure about yourself  0  0 0   Trouble concentrating 0  0 0   Moving slowly or fidgety/restless 0  0 0   Suicidal thoughts 0  0 0   PHQ-9 Score 0  0 0     Hypertension:  BP Readings from Last 3 Encounters:  12/06/22 118/72  12/01/21 128/74  07/19/21 132/82    Obesity: Wt Readings from Last 3 Encounters:  12/06/22 222 lb (100.7 kg)  12/01/21  215 lb (97.5 kg)  07/19/21 223 lb (101.2 kg)   BMI Readings from Last 3 Encounters:  12/06/22 34.77 kg/m  12/01/21 32.69 kg/m  07/19/21 33.91 kg/m     Lipids:  Lab Results  Component Value Date   CHOL 175 12/01/2021   CHOL 154 11/30/2020   CHOL 183 11/26/2019   Lab Results  Component Value Date   HDL 38 (L) 12/01/2021   HDL 31 (L) 11/30/2020   HDL 33 (L) 11/26/2019   Lab Results  Component Value Date   LDLCALC 118 (H) 12/01/2021   LDLCALC 101 (H) 11/30/2020   LDLCALC 125 (H) 11/26/2019   Lab Results  Component Value Date   TRIG 90 12/01/2021   TRIG 119 11/30/2020   TRIG 132 11/26/2019   Lab Results  Component Value Date   CHOLHDL 4.6 12/01/2021   CHOLHDL 5.0 (H) 11/30/2020   CHOLHDL 5.5 (H) 11/26/2019  No results found for: "LDLDIRECT" Glucose:  Glucose, Bld  Date Value Ref Range Status  12/01/2021 90 65 - 99 mg/dL Final    Comment:    .            Fasting reference interval .   11/30/2020 89 65 - 99 mg/dL Final    Comment:    .            Fasting reference interval .   11/26/2019 93 65 - 99 mg/dL Final    Comment:    .            Fasting reference interval .     Flowsheet Row Video Visit from 07/28/2022 in [facility]  AUDIT-C Score 0       Married STD testing and prevention (HIV/chl/gon/syphilis): 11/26/19 Sexual history: one partner , uses cialis prn  Hep C Screening: 11/26/19 Skin cancer: Discussed monitoring for atypical lesions Colorectal cancer: 06/23/16 Prostate cancer:  N/A   Lung cancer:  Low Dose CT Chest recommended if Age 58-80 years, 30 pack-year currently smoking OR have quit w/in 15years. Patient  no a candidate for screening   AAA: The USPSTF recommends one-time screening with ultrasonography in men ages 35 to 75 years who have ever smoked. Patient is not a candidate for screening  ECG: check next visit   Vaccines:   HPV: N/A Tdap: up to date Shingrix: refused  Pneumonia: N/A Flu:  refused  COVID-19: up to date  Advanced Care Planning: A voluntary discussion about advance care planning including the explanation and discussion of advance directives.  Discussed health care proxy and Living will, and the patient was able to identify a health care proxy as wife .  Patient does not have a living will and power of attorney of health care   Patient Active Problem List   Diagnosis Date Noted   Other male erectile dysfunction 07/19/2021   Dyslipidemia 07/19/2021   Pre-diabetes 07/19/2021   Elevated uric acid in blood 07/19/2021   Chronic reflux esophagitis 10/05/2016    Past Surgical History:  Procedure Laterality Date   COLONOSCOPY     ESOPHAGOGASTRODUODENOSCOPY     ESOPHAGOGASTRODUODENOSCOPY (EGD) WITH PROPOFOL N/A 11/07/2016   Procedure: ESOPHAGOGASTRODUODENOSCOPY (EGD) WITH PROPOFOL;  Surgeon: Christena Deem, MD;  Location: [facility] ENDOSCOPY;  Service: Endoscopy;  Laterality: N/A;    Family History  Problem Relation Age of Onset   Hypertension Father    Diabetes Father    Stroke Father    Cancer Other     Social History   Socioeconomic History   Marital status: Married    Spouse name: Not on file   Number of children: 3   Years of education: Not on file   Highest education level: 12th grade  Occupational History   Occupation: driver - trucks     Comment: [employer]  Tobacco Use   Smoking status: Never   Smokeless tobacco: Never  Vaping Use   Vaping Use: Never used  Substance and Sexual Activity   Alcohol use: No   Drug use: No   Sexual activity: Yes    Partners: Female    Comment: wife had hysterectomy   Other Topics Concern   Not on file  Social History Narrative   Not on file   Social Determinants of Health   Financial Resource Strain: Low Risk  (12/01/2021)   Overall Financial Resource Strain (CARDIA)    Difficulty of Paying Living Expenses: Not hard at all  Food Insecurity: No Food Insecurity (12/01/2021)   Hunger Vital Sign    Worried About  Running Out of Food in the Last Year: Never true    Ran Out of Food in the Last Year: Never true  Transportation Needs: No Transportation Needs (12/01/2021)   PRAPARE - Administrator, Civil Service (Medical): No    Lack of Transportation (Non-Medical): No  Physical Activity: Sufficiently Active (12/01/2021)   Exercise Vital Sign    Days of Exercise per Week: 5 days    Minutes of Exercise per Session: 60 min  Stress: No Stress Concern Present (12/01/2021)   [employer] of Occupational Health - Occupational Stress Questionnaire    Feeling of Stress : Not at all  Social Connections: Moderately Integrated (12/01/2021)   Social Connection and Isolation Panel [NHANES]    Frequency of Communication with Friends and Family: More than three times a week    Frequency of Social Gatherings with Friends and Family: Once a week    Attends Religious Services: More than 4 times per year    Active Member of [employer] or Organizations: No    Attends Banker Meetings: Never    Marital Status: Married  Catering manager Violence: Not At Risk (12/01/2021)   Humiliation, Afraid, Rape, and Kick questionnaire    Fear of Current or Ex-Partner: No    Emotionally Abused: No    Physically Abused: No    Sexually Abused: No     Current Outpatient Medications:    cetirizine (ZYRTEC) 10 MG tablet, Take 1 tablet (10 mg total) by mouth daily., Disp: 90 tablet, Rfl: 3   Cholecalciferol (VITAMIN D) 50 MCG (2000 UT) CAPS, Take 1 capsule (2,000 Units total) by mouth daily., Disp: 30 capsule, Rfl: 0   fluticasone (FLONASE) 50 MCG/ACT nasal spray, SPRAY 2 SPRAYS INTO EACH NOSTRIL EVERY DAY, Disp: 48 mL, Rfl: 0   pantoprazole (PROTONIX) 20 MG tablet, Take 1 tablet (20 mg total) by mouth daily., Disp: 90 tablet, Rfl: 1   tadalafil (CIALIS) 20 MG tablet, Take 1 tablet (20 mg total) by mouth every other day as needed for erectile dysfunction., Disp: 90 tablet, Rfl: 0   benzonatate (TESSALON) 100 MG  capsule, Take 2 capsules (200 mg total) by mouth 2 (two) times daily as needed for cough. (Patient not taking: Reported on 12/06/2022), Disp: 20 capsule, Rfl: 0  No Known Allergies   ROS  Constitutional: Negative for fever or weight change.  Respiratory: Negative for cough and shortness of breath.   Cardiovascular: Negative for chest pain or palpitations.  Gastrointestinal: Negative for abdominal pain, no bowel changes.  Musculoskeletal: Negative for gait problem , he has noticed muscle pain, joint pains lately, taking tylenol, discussed naproxen bid with food  Skin: Negative for rash.  Neurological: Negative for dizziness or headache.  No other specific complaints in a complete review of systems (except as listed in HPI above).    Objective  Vitals:   12/06/22 1330  BP: 118/72  Pulse: 86  Resp: 16  SpO2: 96%  Weight: 222 lb (100.7 kg)  Height:  (1.702 m)    Body mass index is 34.77 kg/m.  Physical Exam  Constitutional: Patient appears well-developed and well-nourished.  Obese No distress.  HENT: Head: Normocephalic and atraumatic. Ears: B TMs ok, no erythema or effusion; Nose: Nose normal. Mouth/Throat: Oropharynx is clear and moist. No oropharyngeal exudate.  Eyes: Conjunctivae and EOM are normal. Pupils are equal, round, and reactive to light.  No scleral icterus.  Neck: Normal range of motion. Neck supple. No JVD present. No thyromegaly present.  Cardiovascular: Normal rate, regular rhythm and normal heart sounds.  No murmur heard. No BLE edema. Pulmonary/Chest: Effort normal and breath sounds normal. No respiratory distress. Abdominal: Soft. Bowel sounds are normal, no distension. There is no tenderness. no masses MALE GENITALIA: Normal descended testes bilaterally, no masses palpated, no hernias, no lesions, no discharge RECTAL: not done  Musculoskeletal: Normal range of motion, no joint effusions. No gross deformities Neurological: he is alert and oriented to  person, place, and time. No cranial nerve deficit. Coordination, balance, strength, speech and gait are normal.  Skin: Skin is warm and dry. No rash noted. No erythema.  Psychiatric: Patient has a normal mood and affect. behavior is normal. Judgment and thought content normal.    Fall Risk:    12/06/2022    1:29 PM 07/28/2022    9:51 AM 12/01/2021   11:04 AM 07/19/2021    2:55 PM 11/30/2020    9:19 AM  Fall Risk   Falls in the past year? 0 0 0 0 0  Number falls in past yr: 0 0 0 0 0  Injury with Fall? 0 0 0 0 0  Risk for fall due to : No Fall Risks  No Fall Risks No Fall Risks   Follow up Falls prevention discussed Falls evaluation completed Falls prevention discussed Falls prevention discussed      Functional Status Survey: Is the patient deaf or have difficulty hearing?: No Does the patient have difficulty seeing, even when wearing glasses/contacts?: No Does the patient have difficulty concentrating, remembering, or making decisions?: No Does the patient have difficulty walking or climbing stairs?: No Does the patient have difficulty dressing or bathing?: No Does the patient have difficulty doing errands alone such as visiting a doctor's office or shopping?: No    Assessment & Plan  1. Well adult exam  - Lipid panel - CBC with Differential/Platelet - COMPLETE METABOLIC PANEL WITH GFR - Hemoglobin A1c  2. Dyslipidemia  - Lipid panel  3. Pre-diabetes  - Hemoglobin A1c  4. Elevated uric acid in blood  - Uric acid  5. Myalgia  - CK (Creatine Kinase)  6. Arthralgia, unspecified joint  - CK (Creatine Kinase) - ANA,IFA RA Diag Pnl w/rflx Tit/Patn - Rheumatoid Factor - Sedimentation rate - C-reactive protein  7. Other male erectile dysfunction  - tadalafil (CIALIS) 20 MG tablet; Take 1 tablet (20 mg total) by mouth every other day as needed for erectile dysfunction.  Dispense: 90 tablet; Refill: 0    8. Other fatigue  TSH   -Prostate cancer screening  and PSA options (with potential risks and benefits of testing vs not testing) were discussed along with recent recs/guidelines. -USPSTF grade A and B recommendations reviewed with patient; age-appropriate recommendations, preventive care, screening tests, etc discussed and encouraged; healthy living encouraged; see AVS for patient education given to patient -Discussed importance of 150 minutes of physical activity weekly, eat two servings of fish weekly, eat one serving of tree nuts ( cashews, pistachios, pecans, almonds.Marland Kitchen) every other day, eat 6 servings of fruit/vegetables daily and drink plenty of water and avoid sweet beverages.  -Reviewed Health Maintenance: yes

## 2022-12-05 NOTE — Patient Instructions (Incomplete)

## 2022-12-06 ENCOUNTER — Encounter: Payer: Self-pay | Admitting: Family Medicine

## 2022-12-06 ENCOUNTER — Ambulatory Visit (INDEPENDENT_AMBULATORY_CARE_PROVIDER_SITE_OTHER): Payer: BC Managed Care – PPO | Admitting: Family Medicine

## 2022-12-06 VITALS — BP 118/72 | HR 86 | Resp 16 | Ht 67.0 in | Wt 222.0 lb

## 2022-12-06 DIAGNOSIS — R5383 Other fatigue: Secondary | ICD-10-CM

## 2022-12-06 DIAGNOSIS — N528 Other male erectile dysfunction: Secondary | ICD-10-CM

## 2022-12-06 DIAGNOSIS — E790 Hyperuricemia without signs of inflammatory arthritis and tophaceous disease: Secondary | ICD-10-CM

## 2022-12-06 DIAGNOSIS — Z0000 Encounter for general adult medical examination without abnormal findings: Secondary | ICD-10-CM | POA: Diagnosis not present

## 2022-12-06 DIAGNOSIS — E785 Hyperlipidemia, unspecified: Secondary | ICD-10-CM | POA: Diagnosis not present

## 2022-12-06 DIAGNOSIS — R7303 Prediabetes: Secondary | ICD-10-CM | POA: Diagnosis not present

## 2022-12-06 DIAGNOSIS — M791 Myalgia, unspecified site: Secondary | ICD-10-CM

## 2022-12-06 DIAGNOSIS — M2550 Pain in unspecified joint: Secondary | ICD-10-CM

## 2022-12-06 MED ORDER — TADALAFIL 20 MG PO TABS
20.0000 mg | ORAL_TABLET | ORAL | 0 refills | Status: DC | PRN
Start: 2022-12-06 — End: 2023-07-06

## 2022-12-07 LAB — COMPLETE METABOLIC PANEL WITH GFR
Albumin: 4.5 g/dL (ref 3.6–5.1)
BUN: 14 mg/dL (ref 7–25)
Calcium: 10 mg/dL (ref 8.6–10.3)
Creat: 1.14 mg/dL (ref 0.70–1.30)
Sodium: 140 mmol/L (ref 135–146)
Total Protein: 7.2 g/dL (ref 6.1–8.1)

## 2022-12-07 LAB — CBC WITH DIFFERENTIAL/PLATELET
Basophils Relative: 0.7 %
Lymphs Abs: 3219 cells/uL (ref 850–3900)
MCH: 30.8 pg (ref 27.0–33.0)
Platelets: 228 10*3/uL (ref 140–400)
RDW: 13.3 % (ref 11.0–15.0)

## 2022-12-07 LAB — SEDIMENTATION RATE: Sed Rate: 6 mm/h (ref 0–15)

## 2022-12-07 LAB — HEMOGLOBIN A1C: Hgb A1c MFr Bld: 6.1 % of total Hgb — ABNORMAL HIGH (ref <5.7)

## 2022-12-09 LAB — CBC WITH DIFFERENTIAL/PLATELET
Absolute Monocytes: 847 cells/uL (ref 200–950)
Basophils Absolute: 54 cells/uL (ref 0–200)
Eosinophils Absolute: 231 cells/uL (ref 15–500)
Eosinophils Relative: 3 %
HCT: 43.7 % (ref 38.5–50.0)
Hemoglobin: 14.6 g/dL (ref 13.2–17.1)
MCHC: 33.4 g/dL (ref 32.0–36.0)
MCV: 92.2 fL (ref 80.0–100.0)
MPV: 11.5 fL (ref 7.5–12.5)
Monocytes Relative: 11 %
Neutro Abs: 3350 cells/uL (ref 1500–7800)
Neutrophils Relative %: 43.5 %
RBC: 4.74 10*6/uL (ref 4.20–5.80)
Total Lymphocyte: 41.8 %
WBC: 7.7 10*3/uL (ref 3.8–10.8)

## 2022-12-09 LAB — COMPLETE METABOLIC PANEL WITH GFR
AG Ratio: 1.7 (calc) (ref 1.0–2.5)
ALT: 36 U/L (ref 9–46)
AST: 28 U/L (ref 10–35)
Alkaline phosphatase (APISO): 35 U/L (ref 35–144)
CO2: 29 mmol/L (ref 20–32)
Chloride: 104 mmol/L (ref 98–110)
Globulin: 2.7 g/dL (calc) (ref 1.9–3.7)
Glucose, Bld: 59 mg/dL — ABNORMAL LOW (ref 65–99)
Potassium: 3.9 mmol/L (ref 3.5–5.3)
Total Bilirubin: 0.3 mg/dL (ref 0.2–1.2)
eGFR: 78 mL/min/{1.73_m2} (ref >=60)

## 2022-12-09 LAB — LIPID PANEL
Cholesterol: 186 mg/dL (ref <200)
HDL: 37 mg/dL — ABNORMAL LOW (ref >=40)
LDL Cholesterol (Calc): 123 mg/dL (calc) — ABNORMAL HIGH
Non-HDL Cholesterol (Calc): 149 mg/dL (calc) — ABNORMAL HIGH (ref <130)
Total CHOL/HDL Ratio: 5 (calc) — ABNORMAL HIGH (ref <5.0)
Triglycerides: 143 mg/dL (ref <150)

## 2022-12-09 LAB — ANTI-NUCLEAR AB-TITER (ANA TITER): ANA Titer 1: 1:160 {titer} — ABNORMAL HIGH

## 2022-12-09 LAB — ANA,IFA RA DIAG PNL W/RFLX TIT/PATN
Anti Nuclear Antibody (ANA): POSITIVE — AB
Cyclic Citrullin Peptide Ab: <16 UNITS
Rheumatoid fact SerPl-aCnc: <10 IU/mL (ref <14)

## 2022-12-09 LAB — TSH: TSH: 2.56 mIU/L (ref 0.40–4.50)

## 2022-12-09 LAB — C-REACTIVE PROTEIN: CRP: 5.5 mg/L (ref <8.0)

## 2022-12-09 LAB — CK: Total CK: 279 U/L — ABNORMAL HIGH (ref 44–196)

## 2022-12-09 LAB — URIC ACID: Uric Acid, Serum: 7.8 mg/dL (ref 4.0–8.0)

## 2022-12-09 LAB — HEMOGLOBIN A1C
Mean Plasma Glucose: 128 mg/dL
eAG (mmol/L): 7.1 mmol/L

## 2022-12-13 NOTE — Progress Notes (Unsigned)
Name: Dustin Brady   MRN: [id number]    DOB: 1971-03-22   Date:12/14/2022       Progress Note  Subjective  Chief Complaint  Follow Up  HPI  Pre-diabetes: A1C up again at 6.1 % , denies polyphagia, polydipsia or polyuria. Discussed low carbohydrate diet   Arthralgias: history of gout and elevated uric acid, he used to take Allopurinol. He has a long history of aches and pains, but getting progressively worse . Pain moves around from ankles, knee, elbows and hands. He gets stiff when driving or when first gets up. Sometimes wife has to massage his hands because it is so stiff. Sometimes his elbow gets swollen and red. Daughter had rhabdomyolysis. His ANA is elevated. Uric acid also above goal  Dyslipidemia: low HDL, explained need to exercise more, eat more fish and tree nuts. LDL elevated   ED: he takes Cialis prn, he states it helps achieve and maintain and erection. He does not have high LDL, PVD or diabetes, HTN. Explained he may have more psychological difficulties causing erection dysfunction. Unchanged   GERD: sees GI, he is unable to take PPI daily , he is not sure what it causes, he is now taking it prn   Snoring: also feels tired during the day and wife states he snores loudly and has pause during sleep   Patient Active Problem List   Diagnosis Date Noted   Other male erectile dysfunction 07/19/2021   Dyslipidemia 07/19/2021   Pre-diabetes 07/19/2021   Elevated uric acid in blood 07/19/2021   Chronic reflux esophagitis 10/05/2016    Past Surgical History:  Procedure Laterality Date   COLONOSCOPY     ESOPHAGOGASTRODUODENOSCOPY     ESOPHAGOGASTRODUODENOSCOPY (EGD) WITH PROPOFOL N/A 11/07/2016   Procedure: ESOPHAGOGASTRODUODENOSCOPY (EGD) WITH PROPOFOL;  Surgeon: Christena Deem, MD;  Location: [facility] ENDOSCOPY;  Service: Endoscopy;  Laterality: N/A;    Family History  Problem Relation Age of Onset   Hypertension Father    Diabetes Father    Stroke Father    Cancer Other      Social History   Tobacco Use   Smoking status: Never   Smokeless tobacco: Never  Substance Use Topics   Alcohol use: No     Current Outpatient Medications:    cetirizine (ZYRTEC) 10 MG tablet, Take 1 tablet (10 mg total) by mouth daily., Disp: 90 tablet, Rfl: 3   Cholecalciferol (VITAMIN D) 50 MCG (2000 UT) CAPS, Take 1 capsule (2,000 Units total) by mouth daily., Disp: 30 capsule, Rfl: 0   fluticasone (FLONASE) 50 MCG/ACT nasal spray, SPRAY 2 SPRAYS INTO EACH NOSTRIL EVERY DAY, Disp: 48 mL, Rfl: 0   pantoprazole (PROTONIX) 20 MG tablet, Take 1 tablet (20 mg total) by mouth daily., Disp: 90 tablet, Rfl: 1   tadalafil (CIALIS) 20 MG tablet, Take 1 tablet (20 mg total) by mouth every other day as needed for erectile dysfunction., Disp: 90 tablet, Rfl: 0  No Known Allergies  I personally reviewed active problem list, medication list, allergies, family history, social history, health maintenance with the patient/caregiver today.   ROS  Ten systems reviewed and is negative except as mentioned in HPI   Objective  Vitals:   12/14/22 1111  BP: 116/70  Pulse: 88  Resp: 16  SpO2: 96%  Weight: 222 lb (100.7 kg)  Height: 5\' 7"  (1.702 m)    Body mass index is 34.77 kg/m.  Physical Exam  Constitutional: Patient appears well-developed and well-nourished. Obese  No distress.  HEENT: head atraumatic, normocephalic, pupils equal and reactive to light, neck supple Cardiovascular: Normal rate, regular rhythm and normal heart sounds.  No murmur heard. No BLE edema. Pulmonary/Chest: Effort normal and breath sounds normal. No respiratory distress. Abdominal: Soft.  There is no tenderness. Psychiatric: Patient has a normal mood and affect. behavior is normal. Judgment and thought content normal.  Muscular skeletal: no synovitis    PHQ2/9:    12/14/2022   11:11 AM 12/06/2022    1:29 PM 07/28/2022    9:51 AM 12/01/2021   11:04 AM 07/19/2021    2:55 PM  Depression screen PHQ 2/9   Decreased Interest 0 0 0 0 0  Down, Depressed, Hopeless 0 0 0 0 0  PHQ - 2 Score 0 0 0 0 0  Altered sleeping 0 0  0 0  Tired, decreased energy 0 0  0 0  Change in appetite 0 0  0 0  Feeling bad or failure about yourself  0 0  0 0  Trouble concentrating 0 0  0 0  Moving slowly or fidgety/restless 0 0  0 0  Suicidal thoughts 0 0  0 0  PHQ-9 Score 0 0  0 0    phq 9 is negative   Fall Risk:    12/14/2022   11:11 AM 12/06/2022    1:29 PM 07/28/2022    9:51 AM 12/01/2021   11:04 AM 07/19/2021    2:55 PM  Fall Risk   Falls in the past year? 0 0 0 0 0  Number falls in past yr: 0 0 0 0 0  Injury with Fall? 0 0 0 0 0  Risk for fall due to : No Fall Risks No Fall Risks  No Fall Risks No Fall Risks  Follow up Falls prevention discussed Falls prevention discussed Falls evaluation completed Falls prevention discussed Falls prevention discussed      Functional Status Survey: Is the patient deaf or have difficulty hearing?: No Does the patient have difficulty seeing, even when wearing glasses/contacts?: No Does the patient have difficulty concentrating, remembering, or making decisions?: No Does the patient have difficulty walking or climbing stairs?: No Does the patient have difficulty dressing or bathing?: No Does the patient have difficulty doing errands alone such as visiting a doctor's office or shopping?: No    Assessment & Plan  1. Dyslipidemia  Discussed diet   2. Pre-diabetes  Discussed diet   3. Arthralgia, unspecified joint  - Ambulatory referral to Rheumatology  4. Myalgia  - Ambulatory referral to Rheumatology  5. Elevated antinuclear antibody (ANA) level  - Ambulatory referral to Rheumatology  6. Elevated CK  - Ambulatory referral to Rheumatology  7. Elevated uric acid in blood  - allopurinol (ZYLOPRIM) 100 MG tablet; Take 1 tablet (100 mg total) by mouth daily.  Dispense: 90 tablet; Refill: 1 - Ambulatory referral to Rheumatology  8.  Gastroesophageal reflux disease, unspecified whether esophagitis present   9. Snoring  - Ambulatory referral to Sleep Studies

## 2022-12-14 ENCOUNTER — Ambulatory Visit: Payer: BC Managed Care – PPO | Admitting: Family Medicine

## 2022-12-14 VITALS — BP 116/70 | HR 88 | Resp 16 | Ht 67.0 in | Wt 222.0 lb

## 2022-12-14 DIAGNOSIS — R0683 Snoring: Secondary | ICD-10-CM

## 2022-12-14 DIAGNOSIS — M791 Myalgia, unspecified site: Secondary | ICD-10-CM

## 2022-12-14 DIAGNOSIS — E785 Hyperlipidemia, unspecified: Secondary | ICD-10-CM

## 2022-12-14 DIAGNOSIS — K219 Gastro-esophageal reflux disease without esophagitis: Secondary | ICD-10-CM

## 2022-12-14 DIAGNOSIS — R7303 Prediabetes: Secondary | ICD-10-CM

## 2022-12-14 DIAGNOSIS — M2550 Pain in unspecified joint: Secondary | ICD-10-CM | POA: Diagnosis not present

## 2022-12-14 DIAGNOSIS — R748 Abnormal levels of other serum enzymes: Secondary | ICD-10-CM

## 2022-12-14 DIAGNOSIS — R7689 Other specified abnormal immunological findings in serum: Secondary | ICD-10-CM

## 2022-12-14 DIAGNOSIS — R768 Other specified abnormal immunological findings in serum: Secondary | ICD-10-CM

## 2022-12-14 DIAGNOSIS — E790 Hyperuricemia without signs of inflammatory arthritis and tophaceous disease: Secondary | ICD-10-CM

## 2022-12-14 MED ORDER — ALLOPURINOL 100 MG PO TABS
100.0000 mg | ORAL_TABLET | Freq: Every day | ORAL | 1 refills | Status: DC
Start: 2022-12-14 — End: 2023-05-03

## 2022-12-27 ENCOUNTER — Ambulatory Visit: Payer: BC Managed Care – PPO | Attending: Otolaryngology

## 2022-12-27 DIAGNOSIS — R0683 Snoring: Secondary | ICD-10-CM | POA: Diagnosis present

## 2023-01-11 ENCOUNTER — Telehealth (HOSPITAL_BASED_OUTPATIENT_CLINIC_OR_DEPARTMENT_OTHER): Payer: BC Managed Care – PPO | Admitting: Pulmonary Disease

## 2023-01-11 DIAGNOSIS — G4733 Obstructive sleep apnea (adult) (pediatric): Secondary | ICD-10-CM | POA: Diagnosis not present

## 2023-01-11 NOTE — Telephone Encounter (Signed)
NPSG  showed mod  OSA with AHI 23/ hr & low sat of 83% Events mainly during REM supine sleep Consider CPAP vs oral appliance

## 2023-01-12 NOTE — Telephone Encounter (Signed)
Noted.  Results routed to referring provider.  

## 2023-01-16 ENCOUNTER — Other Ambulatory Visit: Payer: Self-pay

## 2023-01-16 DIAGNOSIS — G4733 Obstructive sleep apnea (adult) (pediatric): Secondary | ICD-10-CM

## 2023-01-16 DIAGNOSIS — R0683 Snoring: Secondary | ICD-10-CM

## 2023-01-16 NOTE — Telephone Encounter (Signed)
CPAP ordered and faxed to [facility]

## 2023-01-17 ENCOUNTER — Encounter: Payer: Self-pay | Admitting: Family Medicine

## 2023-01-18 ENCOUNTER — Encounter: Payer: Self-pay | Admitting: Family Medicine

## 2023-01-18 ENCOUNTER — Ambulatory Visit: Payer: BC Managed Care – PPO | Admitting: Family Medicine

## 2023-01-18 VITALS — BP 118/80 | HR 87 | Temp 98.0°F | Resp 16 | Ht 67.0 in | Wt 219.7 lb

## 2023-01-18 DIAGNOSIS — G4733 Obstructive sleep apnea (adult) (pediatric): Secondary | ICD-10-CM

## 2023-01-18 DIAGNOSIS — M791 Myalgia, unspecified site: Secondary | ICD-10-CM | POA: Diagnosis not present

## 2023-01-18 DIAGNOSIS — R748 Abnormal levels of other serum enzymes: Secondary | ICD-10-CM

## 2023-01-18 DIAGNOSIS — M2550 Pain in unspecified joint: Secondary | ICD-10-CM

## 2023-01-18 NOTE — Progress Notes (Signed)
Name: Dustin Brady   MRN: [id number]    DOB: September 15, 1970   Date:01/18/2023       Progress Note  Subjective  Chief Complaint  Chief Complaint  Patient presents with   Referral    Discuss referral that was made with Dr.Patel    HPI  Patient came in today with his wife.   He continues to have generalized body aches, muscle aches and joint aches, sometimes back pain that is sharp.   He saw Dr. Allena Katz ( Rheumatologist at [facility] ) he ordered more labs, x-rays ( reviewed with patient) and was given Celebrex , he states on the third day he developed sever dry mouth, cold chills and felt dehydrated so he stopped medication. He would like to be referred to Rheumatologist at [facility] - same place his daughter is getting evaluated.   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 He discussed it with Dr. Allena Katz and states he advised him to back down on the amount of weight he is lifting. We will write him a work note limiting lifting to 30 lbs but explained that he may have to come in to fill out FMLA forms if needed   OSA: he is waiting for CPAP supplies.   Patient Active Problem List   Diagnosis Date Noted   Other male erectile dysfunction 07/19/2021   Dyslipidemia 07/19/2021   Pre-diabetes 07/19/2021   Elevated uric acid in blood 07/19/2021   Chronic reflux esophagitis 10/05/2016    Past Surgical History:  Procedure Laterality Date   COLONOSCOPY     ESOPHAGOGASTRODUODENOSCOPY     ESOPHAGOGASTRODUODENOSCOPY (EGD) WITH PROPOFOL N/A 11/07/2016   Procedure: ESOPHAGOGASTRODUODENOSCOPY (EGD) WITH PROPOFOL;  Surgeon: Christena Deem, MD;  Location: [facility] ENDOSCOPY;  Service: Endoscopy;  Laterality: N/A;    Family History  Problem Relation Age of Onset   Hypertension Father    Diabetes Father    Stroke  Father    Cancer Other     Social History   Tobacco Use   Smoking status: Never   Smokeless tobacco: Never  Substance Use Topics   Alcohol use: No     Current Outpatient Medications:    allopurinol (ZYLOPRIM) 100 MG tablet, Take 1 tablet (100 mg total) by mouth daily., Disp: 90 tablet, Rfl: 1   cetirizine (ZYRTEC) 10 MG tablet, Take 1 tablet (10 mg total) by mouth daily., Disp: 90 tablet, Rfl: 3   Cholecalciferol (VITAMIN D) 50 MCG (2000 UT) CAPS, Take 1 capsule (2,000 Units total) by mouth daily., Disp: 30 capsule, Rfl: 0   fluticasone (FLONASE) 50 MCG/ACT nasal spray, SPRAY 2 SPRAYS INTO EACH NOSTRIL EVERY DAY, Disp: 48 mL, Rfl: 0   pantoprazole (PROTONIX) 20 MG tablet, Take 1 tablet (20 mg total) by mouth daily., Disp: 90 tablet, Rfl: 1   tadalafil (CIALIS) 20 MG tablet, Take 1 tablet (20 mg total) by mouth every other day as needed for erectile dysfunction., Disp: 90 tablet, Rfl: 0   celecoxib (CELEBREX) 100 MG capsule, Take 1 capsule by mouth 2 (two) times daily. (Patient not taking: Reported on 01/18/2023), Disp: , Rfl:   No Known Allergies  I personally reviewed active problem list, medication list, allergies, family history, social history with the patient/caregiver today.   ROS  Ten systems  reviewed and is negative except as mentioned in HPI   Objective  Vitals:   01/18/23 1125  BP: 118/80  Pulse: 87  Resp: 16  Temp: 98 F (36.7 C)  TempSrc: Oral  SpO2: 97%  Weight: 219 lb 11.2 oz (99.7 kg)  Height: 5\' 7"  (1.702 m)    Body mass index is 34.41 kg/m.  Physical Exam  Constitutional: Patient appears well-developed and well-nourished. Obese  No distress.  HEENT: head atraumatic, normocephalic, pupils equal and reactive to light, neck supple Cardiovascular: Normal rate, regular rhythm and normal heart sounds.  No murmur heard. No BLE edema. Pulmonary/Chest: Effort normal and breath sounds normal. No respiratory distress. Abdominal: Soft.  There is no  tenderness. Muscular skeletal: muscular build, normal strength today  Psychiatric: Patient has a normal mood and affect. behavior is normal. Judgment and thought content normal.   Recent Results (from the past 2160 hour(s))  Lipid panel     Status: Abnormal   Collection Time: 12/06/22  2:18 PM  Result Value Ref Range   Cholesterol 186 <200 mg/dL   HDL 37 (L) > OR = 40 mg/dL   Triglycerides 161 <096 mg/dL   LDL Cholesterol (Calc) 123 (H) mg/dL (calc)    Comment: Reference range: <100 . Desirable range <100 mg/dL for primary prevention;   <70 mg/dL for patients with CHD or diabetic patients  with > or = 2 CHD risk factors. Marland Kitchen LDL-C is now calculated using the Martin-Hopkins  calculation, which is a validated novel method providing  better accuracy than the Friedewald equation in the  estimation of LDL-C.  Horald Pollen et al. Lenox Ahr. 0454;098(11): 2061-2068  (http://education.[facility].com/faq/FAQ164)    Total CHOL/HDL Ratio 5.0 (H) <5.0 (calc)   Non-HDL Cholesterol (Calc) 149 (H) <130 mg/dL (calc)    Comment: For patients with diabetes plus 1 major ASCVD risk  factor, treating to a non-HDL-C goal of <100 mg/dL  (LDL-C of <91 mg/dL) is considered a therapeutic  option.   CBC with Differential/Platelet     Status: None   Collection Time: 12/06/22  2:18 PM  Result Value Ref Range   WBC 7.7 3.8 - 10.8 Thousand/uL   RBC 4.74 4.20 - 5.80 Million/uL   Hemoglobin 14.6 13.2 - 17.1 g/dL   HCT 47.8 29.5 - 62.1 %   MCV 92.2 80.0 - 100.0 fL   MCH 30.8 27.0 - 33.0 pg   MCHC 33.4 32.0 - 36.0 g/dL   RDW 30.8 65.7 - 84.6 %   Platelets 228 140 - 400 Thousand/uL   MPV 11.5 7.5 - 12.5 fL   Neutro Abs 3,350 1,500 - 7,800 cells/uL   Lymphs Abs 3,219 850 - 3,900 cells/uL   Absolute Monocytes 847 200 - 950 cells/uL   Eosinophils Absolute 231 15 - 500 cells/uL   Basophils Absolute 54 0 - 200 cells/uL   Neutrophils Relative % 43.5 %   Total Lymphocyte 41.8 %   Monocytes Relative 11.0 %    Eosinophils Relative 3.0 %   Basophils Relative 0.7 %  COMPLETE METABOLIC PANEL WITH GFR     Status: Abnormal   Collection Time: 12/06/22  2:18 PM  Result Value Ref Range   Glucose, Bld 59 (L) 65 - 99 mg/dL    Comment: .            Fasting reference interval .    BUN 14 7 - 25 mg/dL   Creat 9.62 9.52 - 8.41 mg/dL   eGFR 78 > OR = 60 LK/GMW/1.02V2  BUN/Creatinine Ratio SEE NOTE: 6 - 22 (calc)    Comment:    Not Reported: BUN and Creatinine are within    reference range. .    Sodium 140 135 - 146 mmol/L   Potassium 3.9 3.5 - 5.3 mmol/L   Chloride 104 98 - 110 mmol/L   CO2 29 20 - 32 mmol/L   Calcium 10.0 8.6 - 10.3 mg/dL   Total Protein 7.2 6.1 - 8.1 g/dL   Albumin 4.5 3.6 - 5.1 g/dL   Globulin 2.7 1.9 - 3.7 g/dL (calc)   AG Ratio 1.7 1.0 - 2.5 (calc)   Total Bilirubin 0.3 0.2 - 1.2 mg/dL   Alkaline phosphatase (APISO) 35 35 - 144 U/L   AST 28 10 - 35 U/L   ALT 36 9 - 46 U/L  Hemoglobin A1c     Status: Abnormal   Collection Time: 12/06/22  2:18 PM  Result Value Ref Range   Hgb A1c MFr Bld 6.1 (H) <5.7 % of total Hgb    Comment: For someone without known diabetes, a hemoglobin  A1c value between 5.7% and 6.4% is consistent with prediabetes and should be confirmed with a  follow-up test. . For someone with known diabetes, a value <7% indicates that their diabetes is well controlled. A1c targets should be individualized based on duration of diabetes, age, comorbid conditions, and other considerations. . This assay result is consistent with an increased risk of diabetes. . Currently, no consensus exists regarding use of hemoglobin A1c for diagnosis of diabetes for children. .    Mean Plasma Glucose 128 mg/dL   eAG (mmol/L) 7.1 mmol/L    Comment: . This test was performed on the Roche cobas c503 platform. Effective 05/23/22, a change in test platforms from the Abbott Architect to the Roche cobas c503 may have shifted HbA1c results compared to historical  results. Based on laboratory validation testing conducted at [facility], the Roche platform relative to the Abbott platform had an average increase in HbA1c value of < or = 0.3%. This difference is within accepted  variability established by the [facility] Note that not all individuals will have had a shift in their results and direct comparisons between historical and current results for testing conducted on different platforms is not recommended.   Uric acid     Status: None   Collection Time: 12/06/22  2:18 PM  Result Value Ref Range   Uric Acid, Serum 7.8 4.0 - 8.0 mg/dL    Comment: Therapeutic target for gout patients: <6.0 mg/dL .   CK (Creatine Kinase)     Status: Abnormal   Collection Time: 12/06/22  2:18 PM  Result Value Ref Range   Total CK 279 (H) 44 - 196 U/L  ANA,IFA RA Diag Pnl w/rflx Tit/Patn     Status: Abnormal   Collection Time: 12/06/22  2:18 PM  Result Value Ref Range   Anti Nuclear Antibody (ANA) POSITIVE (A) NEGATIVE    Comment: ANA IFA is a first line screen for detecting the presence of up to approximately 150 autoantibodies in various autoimmune diseases. A positive ANA IFA result is suggestive of autoimmune disease and reflexes to titer and pattern. Further laboratory testing may be considered if clinically indicated. . For additional information, please refer to http://education.[facility].com/faq/FAQ177 (This link is being provided for informational/ educational purposes only.) .    Rheumatoid fact SerPl-aCnc <10 <14 IU/mL   Cyclic Citrullin Peptide Ab <16 UNITS    Comment: Reference Range Negative:            <  20 Weak Positive:       20-39 Moderate Positive:   40-59 Strong Positive:     >59 .    INTERPRETATION      Comment: . A positive ANA, IFA indicates that one or more  antibodies associated with connective tissue disease could be positive. The RF and CCP assays are each  65-70% sensitive for  established rheumatoid arthritis.  While it is still possible that this patient has  rheumatoid arthritis, other connective tissue  diseases should be considered.  .   Sedimentation rate     Status: None   Collection Time: 12/06/22  2:18 PM  Result Value Ref Range   Sed Rate 6 0 - 15 mm/h  C-reactive protein     Status: None   Collection Time: 12/06/22  2:18 PM  Result Value Ref Range   CRP 5.5 <8.0 mg/L  TSH     Status: None   Collection Time: 12/06/22  2:18 PM  Result Value Ref Range   TSH 2.56 0.40 - 4.50 mIU/L  Anti-nuclear ab-titer (ANA titer)     Status: Abnormal   Collection Time: 12/06/22  2:18 PM  Result Value Ref Range   ANA Titer 1 1:160 (H) titer    Comment:                 Reference Range                 <1:40        Negative                 1:40-1:80    Low Antibody Level                 >1:80        Elevated Antibody Level .    ANA Pattern 1 Nuclear, Dense Fine Speckled (A)     Comment: Dense fine speckled pattern is seen in normal individuals and rarely associated with systemic lupus erythematosis (SLE), Sjogren's syndrome and systemic sclerosis. . AC-2: Dense Fine Speckled . International Consensus on ANA Patterns ([email or web address])       PHQ2/9:    01/18/2023   11:25 AM 12/14/2022   11:11 AM 12/06/2022    1:29 PM 07/28/2022    9:51 AM 12/01/2021   11:04 AM  Depression screen PHQ 2/9  Decreased Interest 0 0 0 0 0  Down, Depressed, Hopeless 0 0 0 0 0  PHQ - 2 Score 0 0 0 0 0  Altered sleeping 0 0 0  0  Tired, decreased energy 0 0 0  0  Change in appetite 0 0 0  0  Feeling bad or failure about yourself  0 0 0  0  Trouble concentrating 0 0 0  0  Moving slowly or fidgety/restless 0 0 0  0  Suicidal thoughts 0 0 0  0  PHQ-9 Score 0 0 0  0  Difficult doing work/chores Not difficult at all        phq 9 is negative   Fall Risk:    01/18/2023   11:25 AM 12/14/2022   11:11 AM 12/06/2022    1:29 PM 07/28/2022    9:51 AM  12/01/2021   11:04 AM  Fall Risk   Falls in the past year? 0 0 0 0 0  Number falls in past yr: 0 0 0 0 0  Injury with Fall? 0 0 0 0 0  Risk for fall due to : No Fall  Risks No Fall Risks No Fall Risks  No Fall Risks  Follow up Falls prevention discussed;Education provided;Falls evaluation completed Falls prevention discussed Falls prevention discussed Falls evaluation completed Falls prevention discussed    Functional Status Survey: Is the patient deaf or have difficulty hearing?: No Does the patient have difficulty seeing, even when wearing glasses/contacts?: No Does the patient have difficulty concentrating, remembering, or making decisions?: No Does the patient have difficulty walking or climbing stairs?: No Does the patient have difficulty dressing or bathing?: No Does the patient have difficulty doing errands alone such as visiting a doctor's office or shopping?: No    Assessment & Plan   1. Myalgia  - Ambulatory referral to Rheumatology  We will add work limitations, avoid NSAID due to previous history of GI bleed and GERD symptoms, may take Tylenol and stay hydrated   2. Arthralgia, unspecified joint  - Ambulatory referral to Rheumatology  3. Elevated CK  - Ambulatory referral to [facility]ology at [facility]   4. OSA (obstructive sleep apnea)  We already sent the orders

## 2023-01-19 ENCOUNTER — Other Ambulatory Visit: Payer: Self-pay | Admitting: Family Medicine

## 2023-01-19 ENCOUNTER — Encounter: Payer: Self-pay | Admitting: Emergency Medicine

## 2023-01-19 ENCOUNTER — Encounter: Payer: Self-pay | Admitting: Family Medicine

## 2023-02-01 ENCOUNTER — Other Ambulatory Visit: Payer: Self-pay | Admitting: Family Medicine

## 2023-02-01 DIAGNOSIS — M0609 Rheumatoid arthritis without rheumatoid factor, multiple sites: Secondary | ICD-10-CM

## 2023-02-01 DIAGNOSIS — Z79899 Other long term (current) drug therapy: Secondary | ICD-10-CM

## 2023-02-01 LAB — CBC WITH DIFFERENTIAL/PLATELET
Basophils Absolute: 52 cells/uL (ref 0–200)
Basophils Relative: 0.8 %
Eosinophils Relative: 4 %
Platelets: 223 10*3/uL (ref 140–400)
RBC: 4.63 10*6/uL (ref 4.20–5.80)

## 2023-02-01 LAB — C-REACTIVE PROTEIN: CRP: 5.6 mg/L (ref <8.0)

## 2023-02-02 LAB — CBC WITH DIFFERENTIAL/PLATELET
Absolute Monocytes: 540 cells/uL (ref 200–950)
Eosinophils Absolute: 260 cells/uL (ref 15–500)
MCHC: 33.4 g/dL (ref 32.0–36.0)
Neutrophils Relative %: 44.3 %

## 2023-02-02 LAB — ALT: ALT: 25 U/L (ref 9–46)

## 2023-02-02 LAB — CREATININE, SERUM: Creat: 1.17 mg/dL (ref 0.70–1.30)

## 2023-02-02 LAB — HEPATITIS C ANTIBODY: Hepatitis C Ab: NONREACTIVE

## 2023-02-02 LAB — SEDIMENTATION RATE: Sed Rate: 6 mm/h (ref 0–15)

## 2023-02-02 LAB — HEPATITIS B SURFACE ANTIBODY,QUALITATIVE: Hep B S Ab: NONREACTIVE

## 2023-02-03 LAB — CBC WITH DIFFERENTIAL/PLATELET
HCT: 42.5 % (ref 38.5–50.0)
Hemoglobin: 14.2 g/dL (ref 13.2–17.1)
Lymphs Abs: 2769 cells/uL (ref 850–3900)
MCH: 30.7 pg (ref 27.0–33.0)
MCV: 91.8 fL (ref 80.0–100.0)
MPV: 11.5 fL (ref 7.5–12.5)
Monocytes Relative: 8.3 %
Neutro Abs: 2880 cells/uL (ref 1500–7800)
RDW: 13.1 % (ref 11.0–15.0)
Total Lymphocyte: 42.6 %
WBC: 6.5 10*3/uL (ref 3.8–10.8)

## 2023-02-03 LAB — AST: AST: 22 U/L (ref 10–35)

## 2023-02-03 LAB — QUANTIFERON-TB GOLD PLUS
Mitogen-NIL: 8.55 IU/mL
NIL: 0.09 IU/mL
QuantiFERON-TB Gold Plus: NEGATIVE
TB1-NIL: 0.02 IU/mL
TB2-NIL: 0 IU/mL

## 2023-02-03 LAB — ALBUMIN: Albumin: 4.4 g/dL (ref 3.6–5.1)

## 2023-02-06 NOTE — Progress Notes (Signed)
Completed with original labwork that was faxed

## 2023-04-05 NOTE — Progress Notes (Unsigned)
Name: Dustin Brady   MRN: [id number]    DOB: June 23, 1971   Date:04/06/2023       Progress Note  Subjective  Chief Complaint  Paperwork  HPI  RA: he is under the care of Dr. Allena Katz and is taking Methotrexate , he still has joint stiffness and arthralgias. 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   Patient Active Problem List   Diagnosis Date Noted   Other male erectile dysfunction 07/19/2021   Dyslipidemia 07/19/2021   Pre-diabetes 07/19/2021   Elevated uric acid in blood 07/19/2021   Chronic reflux esophagitis 10/05/2016    Past Surgical History:  Procedure Laterality Date   COLONOSCOPY     ESOPHAGOGASTRODUODENOSCOPY     ESOPHAGOGASTRODUODENOSCOPY (EGD) WITH PROPOFOL N/A 11/07/2016   Procedure: ESOPHAGOGASTRODUODENOSCOPY (EGD) WITH PROPOFOL;  Surgeon: Christena Deem, MD;  Location: [facility] ENDOSCOPY;  Service: Endoscopy;  Laterality: N/A;    Family History  Problem Relation Age of Onset   Hypertension Father    Diabetes Father    Stroke Father    Cancer Other     Social History   Tobacco Use   Smoking status: Never   Smokeless tobacco: Never  Substance Use Topics   Alcohol use: No     Current Outpatient Medications:    allopurinol (ZYLOPRIM) 100 MG tablet, Take 1 tablet (100 mg total) by mouth daily., Disp: 90 tablet, Rfl: 1   cetirizine (ZYRTEC) 10 MG tablet, Take 1 tablet (10 mg total) by mouth daily., Disp: 90 tablet, Rfl: 3   Cholecalciferol (VITAMIN D) 50 MCG (2000 UT) CAPS, Take 1 capsule (2,000 Units total) by mouth daily., Disp: 30 capsule, Rfl: 0   cyclobenzaprine (FLEXERIL) 10 MG tablet, Take 10 mg by mouth 2 (two) times daily as needed., Disp: , Rfl:    fluticasone (FLONASE) 50 MCG/ACT nasal spray, SPRAY 2 SPRAYS INTO EACH NOSTRIL EVERY DAY, Disp: 48 mL, Rfl: 0   methotrexate (RHEUMATREX) 2.5 MG tablet, SMARTSIG:5 Tablet(s) By Mouth Once a Week, Disp: , Rfl:     pantoprazole (PROTONIX) 20 MG tablet, Take 1 tablet (20 mg total) by mouth daily., Disp: 90 tablet, Rfl: 1   tadalafil (CIALIS) 20 MG tablet, Take 1 tablet (20 mg total) by mouth every other day as needed for erectile dysfunction., Disp: 90 tablet, Rfl: 0  Allergies  Allergen Reactions   Celebrex [Celecoxib] Other (See Comments)    Felt dehydrated and dry mouth     I personally reviewed active problem list, medication list, allergies, family history, social history, health maintenance with the patient/caregiver today.   ROS  Ten systems reviewed and is negative except as mentioned in HPI    Objective  Vitals:   04/06/23 0921  BP: 114/72  Pulse: 82  Resp: 16  Temp: 98 F (36.7 C)  TempSrc: Oral  SpO2: 96%  Weight: 221 lb 14.4 oz (100.7 kg)  Height: 5\' 8"  (1.727 m)    Body mass index is 33.74 kg/m.  Physical Exam  Constitutional: Patient appears well-developed and well-nourished. Obese  No distress.  HEENT: head atraumatic, normocephalic, pupils equal and reactive to light, neck supple Cardiovascular: Normal rate, regular rhythm and normal heart sounds.  No murmur heard. No BLE edema. Pulmonary/Chest: Effort normal and breath sounds normal. No respiratory distress. Abdominal: Soft.  There is no tenderness. Muscular skeletal: no synovitis , pain on lower back  Psychiatric:  Patient has a normal mood and affect. behavior is normal. Judgment and thought content normal.    PHQ2/9:    04/06/2023    9:24 AM 01/18/2023   11:25 AM 12/14/2022   11:11 AM 12/06/2022    1:29 PM 07/28/2022    9:51 AM  Depression screen PHQ 2/9  Decreased Interest 0 0 0 0 0  Down, Depressed, Hopeless 0 0 0 0 0  PHQ - 2 Score 0 0 0 0 0  Altered sleeping  0 0 0   Tired, decreased energy  0 0 0   Change in appetite  0 0 0   Feeling bad or failure about yourself   0 0 0   Trouble concentrating  0 0 0   Moving slowly or fidgety/restless  0 0 0   Suicidal thoughts  0 0 0   PHQ-9 Score  0 0 0    Difficult doing work/chores  Not difficult at all       phq 9 is negative   Fall Risk:    04/06/2023    9:24 AM 01/18/2023   11:25 AM 12/14/2022   11:11 AM 12/06/2022    1:29 PM 07/28/2022    9:51 AM  Fall Risk   Falls in the past year? 0 0 0 0 0  Number falls in past yr:  0 0 0 0  Injury with Fall?  0 0 0 0  Risk for fall due to : No Fall Risks No Fall Risks No Fall Risks No Fall Risks   Follow up  Falls prevention discussed;Education provided;Falls evaluation completed Falls prevention discussed Falls prevention discussed Falls evaluation completed      Functional Status Survey: Is the patient deaf or have difficulty hearing?: No Does the patient have difficulty seeing, even when wearing glasses/contacts?: No Does the patient have difficulty concentrating, remembering, or making decisions?: No Does the patient have difficulty walking or climbing stairs?: No Does the patient have difficulty dressing or bathing?: No Does the patient have difficulty doing errands alone such as visiting a doctor's office or shopping?: No    Assessment & Plan  1. 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 Allena Katz  Unable to lift heavily frequently, able to drive without restrictions , forms filled out  2. Elevated CK  Under the care of Dr. Allena Katz  3. Immunosuppression due to drug therapy (HCC)  On methotrexate

## 2023-04-06 ENCOUNTER — Ambulatory Visit: Payer: BC Managed Care – PPO | Admitting: Family Medicine

## 2023-04-06 ENCOUNTER — Encounter: Payer: Self-pay | Admitting: Family Medicine

## 2023-04-06 VITALS — BP 114/72 | HR 82 | Temp 98.0°F | Resp 16 | Ht 68.0 in | Wt 221.9 lb

## 2023-04-06 DIAGNOSIS — M0609 Rheumatoid arthritis without rheumatoid factor, multiple sites: Secondary | ICD-10-CM | POA: Diagnosis not present

## 2023-04-06 DIAGNOSIS — D84821 Immunodeficiency due to drugs: Secondary | ICD-10-CM | POA: Diagnosis not present

## 2023-04-06 DIAGNOSIS — Z79899 Other long term (current) drug therapy: Secondary | ICD-10-CM | POA: Diagnosis not present

## 2023-04-06 DIAGNOSIS — R748 Abnormal levels of other serum enzymes: Secondary | ICD-10-CM | POA: Insufficient documentation

## 2023-05-02 ENCOUNTER — Other Ambulatory Visit: Payer: Self-pay | Admitting: Family Medicine

## 2023-05-02 DIAGNOSIS — E790 Hyperuricemia without signs of inflammatory arthritis and tophaceous disease: Secondary | ICD-10-CM

## 2023-07-04 ENCOUNTER — Ambulatory Visit: Payer: BC Managed Care – PPO | Admitting: Family Medicine

## 2023-07-05 NOTE — Progress Notes (Unsigned)
Name: Dustin Brady   MRN: [id number]    DOB: 1971/04/13   Date:07/06/2023       Progress Note  Subjective  Chief Complaint  Follow Up  HPI  Discussed the use of AI scribe software for clinical note transcription with the patient, who gave verbal consent to proceed.  History of Present Illness   The patient, with a known history of rheumatoid arthritis, presents for a routine six-month follow-up visit. He has been under the care of a rheumatologist and has been taking methotrexate as prescribed without any changes in dosage. The patient reports that joint swelling has not been significant, but he has been experiencing stiffness, particularly in the mornings. This stiffness reportedly takes hours to subside after initiating movement.  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 The patient's employer has attempted to accommodate his condition by assigning him to a different position, but the patient still deals with heavy weights, albeit not as continuously as before. The patient reports that his pain level after work varies, sometimes reaching a 6 out of 10, depending on the intensity of the workday.  The patient has been on vacation for the past week and reports feeling relatively well until the weather changed, which seemed to exacerbate his symptoms. He also reports taking Flexeril at night due to the physical demands of his job.  In addition to his rheumatoid arthritis, the patient has been managing high cholesterol. His most recent cholesterol levels from April showed a bad cholesterol level of 123 and a good cholesterol level of 37. Based on these values and his current blood pressure, his risk of heart attack and stroke over the next ten years is calculated to be 4.9%.  The patient also reports erectile dysfunction and has been taking Cialis 20mg  as needed. He reports that the medication helps to initiate  an erection but struggles to maintain it. The patient is open to trying Viagra to see if it provides better results.  The patient also takes a stomach pill for reflux, heartburn, and indigestion, and a nasal spray for allergies. He has not refilled these prescriptions in a while but would like a refill of the stomach pill. He also takes allopurinol for gout, which is managed by his rheumatologist.  He has not yet received a pneumonia shot but is open to receiving one. He has concerns about the flu shot due to a previous experience of feeling unwell after receiving it.         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) 04/06/2023   Other male erectile dysfunction 07/19/2021   Dyslipidemia 07/19/2021   Pre-diabetes 07/19/2021   Elevated uric acid in blood 07/19/2021   Chronic reflux esophagitis 10/05/2016    Past Surgical History:  Procedure Laterality Date   COLONOSCOPY     ESOPHAGOGASTRODUODENOSCOPY     ESOPHAGOGASTRODUODENOSCOPY (EGD) WITH PROPOFOL N/A 11/07/2016   Procedure: ESOPHAGOGASTRODUODENOSCOPY (EGD) WITH PROPOFOL;  Surgeon: Christena Deem, MD;  Location: [facility] ENDOSCOPY;  Service: Endoscopy;  Laterality: N/A;    Family History  Problem Relation Age of Onset   Hypertension Father    Diabetes Father    Stroke Father    Cancer Other     Social History   Tobacco Use   Smoking status: Never  Smokeless tobacco: Never  Substance Use Topics   Alcohol use: No     Current Outpatient Medications:    allopurinol (ZYLOPRIM) 100 MG tablet, TAKE 1 TABLET BY MOUTH EVERY DAY, Disp: 90 tablet, Rfl: 0   cetirizine (ZYRTEC) 10 MG tablet, Take 1 tablet (10 mg total) by mouth daily., Disp: 90 tablet, Rfl: 3   Cholecalciferol (VITAMIN D) 50 MCG (2000 UT) CAPS, Take 1 capsule (2,000 Units total) by mouth daily., Disp:  30 capsule, Rfl: 0   cyclobenzaprine (FLEXERIL) 10 MG tablet, Take 10 mg by mouth 2 (two) times daily as needed., Disp: , Rfl:    fluticasone (FLONASE) 50 MCG/ACT nasal spray, SPRAY 2 SPRAYS INTO EACH NOSTRIL EVERY DAY, Disp: 48 mL, Rfl: 0   folic acid (FOLVITE) 1 MG tablet, Take 1 mg by mouth daily., Disp: , Rfl:    methotrexate (RHEUMATREX) 2.5 MG tablet, SMARTSIG:5 Tablet(s) By Mouth Once a Week, Disp: , Rfl:    sildenafil (VIAGRA) 100 MG tablet, Take 0.5-1 tablets (50-100 mg total) by mouth daily as needed for erectile dysfunction., Disp: 90 tablet, Rfl: 0   pantoprazole (PROTONIX) 20 MG tablet, Take 1 tablet (20 mg total) by mouth daily., Disp: 90 tablet, Rfl: 3  Allergies  Allergen Reactions   Celebrex [Celecoxib] Other (See Comments)    Felt dehydrated and dry mouth     I personally reviewed active problem list, medication list, allergies, family history, social history, health maintenance with the patient/caregiver today.   ROS  Ten systems reviewed and is negative except as mentioned in HPI    Objective  Vitals:   07/06/23 1039  BP: 118/74  Pulse: 95  Resp: 16  Temp: 98 F (36.7 C)  TempSrc: Oral  SpO2: 97%  Weight: 229 lb 6.4 oz (104.1 kg)  Height: 5\' 8"  (1.727 m)    Body mass index is 34.88 kg/m.  Physical Exam  Constitutional: Patient appears well-developed and well-nourished. Obese  No distress.  HEENT: head atraumatic, normocephalic, pupils equal and reactive to light, neck supple Cardiovascular: Normal rate, regular rhythm and normal heart sounds.  No murmur heard. No BLE edema. Pulmonary/Chest: Effort normal and breath sounds normal. No respiratory distress. Abdominal: Soft.  There is no tenderness. Psychiatric: Patient has a normal mood and affect. behavior is normal. Judgment and thought content normal.   PHQ2/9:    07/06/2023   10:41 AM 04/06/2023    9:24 AM 01/18/2023   11:25 AM 12/14/2022   11:11 AM 12/06/2022    1:29 PM  Depression screen PHQ  2/9  Decreased Interest 0 0 0 0 0  Down, Depressed, Hopeless 0 0 0 0 0  PHQ - 2 Score 0 0 0 0 0  Altered sleeping 0  0 0 0  Tired, decreased energy 0  0 0 0  Change in appetite 0  0 0 0  Feeling bad or failure about yourself  0  0 0 0  Trouble concentrating 0  0 0 0  Moving slowly or fidgety/restless 0  0 0 0  Suicidal thoughts 0  0 0 0  PHQ-9 Score 0  0 0 0  Difficult doing work/chores   Not difficult at all      phq 9 is negative   Fall Risk:    07/06/2023   10:41 AM 04/06/2023    9:24 AM 01/18/2023   11:25 AM 12/14/2022   11:11 AM 12/06/2022    1:29 PM  Fall Risk   Falls in the past  year? 0 0 0 0 0  Number falls in past yr:   0 0 0  Injury with Fall?   0 0 0  Risk for fall due to : No Fall Risks No Fall Risks No Fall Risks No Fall Risks No Fall Risks  Follow up Falls prevention discussed  Falls prevention discussed;Education provided;Falls evaluation completed Falls prevention discussed Falls prevention discussed       Assessment & Plan  Assessment and Plan    Rheumatoid Arthritis Stable on Methotrexate 2.5mg  (5 pills). Reports morning stiffness lasting for hours. No significant swelling noted. -Continue Methotrexate as prescribed. -Follow up with Rheumatologist, Dr. Allena Katz, for ongoing management.  Erectile Dysfunction Reports difficulty maintaining erection. Currently on Cialis 20mg  as needed. -Discontinue Cialis. -Start Viagra 100mg  as needed. -Advise patient to avoid high-fat meals before taking medication and to plan for sexual activity within 2 hours of taking the medication.  Hyperlipidemia LDL 123, HDL 37. ASCVD risk score 4.9% over the next 10 years. -No need for statin therapy at this time. -Continue monitoring lipid profile.  Gastroesophageal Reflux Disease On Pantoprazole. -Renew Pantoprazole prescription for 1 year.  Immunizations Patient is immunosuppressed due to Methotrexate use for Rheumatoid Arthritis. -Administer Pneumococcal vaccine  today. -Encourage annual Influenza vaccine, Hepatitis vaccines, and Shingles vaccine at future visits.  Follow-up -Annual follow-up and physical examination. -Check lipid profile periodically.

## 2023-07-06 ENCOUNTER — Encounter: Payer: Self-pay | Admitting: Family Medicine

## 2023-07-06 ENCOUNTER — Ambulatory Visit: Payer: BC Managed Care – PPO | Admitting: Family Medicine

## 2023-07-06 VITALS — BP 118/74 | HR 95 | Temp 98.0°F | Resp 16 | Ht 68.0 in | Wt 229.4 lb

## 2023-07-06 DIAGNOSIS — K219 Gastro-esophageal reflux disease without esophagitis: Secondary | ICD-10-CM

## 2023-07-06 DIAGNOSIS — N528 Other male erectile dysfunction: Secondary | ICD-10-CM | POA: Diagnosis not present

## 2023-07-06 DIAGNOSIS — Z23 Encounter for immunization: Secondary | ICD-10-CM

## 2023-07-06 MED ORDER — PANTOPRAZOLE SODIUM 20 MG PO TBEC
20.0000 mg | DELAYED_RELEASE_TABLET | Freq: Every day | ORAL | 3 refills | Status: DC
Start: 2023-07-06 — End: 2024-07-08

## 2023-07-06 MED ORDER — SILDENAFIL CITRATE 100 MG PO TABS
50.0000 mg | ORAL_TABLET | Freq: Every day | ORAL | 0 refills | Status: DC | PRN
Start: 2023-07-06 — End: 2023-12-21

## 2023-07-06 MED ORDER — PANTOPRAZOLE SODIUM 20 MG PO TBEC: 20.0000 mg | DELAYED_RELEASE_TABLET | Freq: Every day | ORAL | 3 refills | Status: DC

## 2023-10-24 ENCOUNTER — Encounter: Payer: Self-pay | Admitting: *Deleted

## 2023-10-26 ENCOUNTER — Encounter: Payer: Self-pay | Admitting: *Deleted

## 2023-10-30 ENCOUNTER — Encounter: Payer: Self-pay | Admitting: *Deleted

## 2023-11-06 ENCOUNTER — Encounter
Admission: RE | Disposition: A | Discharge status: Home / Self Care | Payer: Self-pay | Source: Home / Self Care | Attending: Gastroenterology

## 2023-11-06 ENCOUNTER — Ambulatory Visit: Admitting: Anesthesiology

## 2023-11-06 ENCOUNTER — Encounter: Payer: Self-pay | Admitting: *Deleted

## 2023-11-06 ENCOUNTER — Ambulatory Visit
Admission: RE | Admit: 2023-11-06 | Discharge: 2023-11-06 | Disposition: A | Discharge status: Home / Self Care | Payer: BC Managed Care – PPO | Attending: Gastroenterology | Admitting: Gastroenterology

## 2023-11-06 DIAGNOSIS — R1012 Left upper quadrant pain: Secondary | ICD-10-CM | POA: Diagnosis present

## 2023-11-06 DIAGNOSIS — K449 Diaphragmatic hernia without obstruction or gangrene: Secondary | ICD-10-CM | POA: Insufficient documentation

## 2023-11-06 DIAGNOSIS — G4730 Sleep apnea, unspecified: Secondary | ICD-10-CM | POA: Diagnosis not present

## 2023-11-06 DIAGNOSIS — K2100 Gastro-esophageal reflux disease with esophagitis, without bleeding: Secondary | ICD-10-CM | POA: Diagnosis not present

## 2023-11-06 DIAGNOSIS — K3189 Other diseases of stomach and duodenum: Secondary | ICD-10-CM | POA: Diagnosis not present

## 2023-11-06 DIAGNOSIS — M069 Rheumatoid arthritis, unspecified: Secondary | ICD-10-CM | POA: Diagnosis not present

## 2023-11-06 HISTORY — PX: ESOPHAGOGASTRODUODENOSCOPY (EGD) WITH PROPOFOL: SHX5813

## 2023-11-06 SURGERY — ESOPHAGOGASTRODUODENOSCOPY (EGD) WITH PROPOFOL
Anesthesia: General

## 2023-11-06 MED ORDER — PROPOFOL 500 MG/50ML IV EMUL
INTRAVENOUS | Status: DC | PRN
  Administered 2023-11-06: 150 ug/kg/min via INTRAVENOUS

## 2023-11-06 MED ORDER — SODIUM CHLORIDE 0.9 % IV SOLN
INTRAVENOUS | Status: DC
  Administered 2023-11-06: 20 mL/h via INTRAVENOUS

## 2023-11-06 MED ORDER — LIDOCAINE HCL (PF) 2 % IJ SOLN
INTRAMUSCULAR | Status: AC
  Filled 2023-11-06: qty 5

## 2023-11-06 MED ORDER — PROPOFOL 1000 MG/100ML IV EMUL
INTRAVENOUS | Status: AC
  Filled 2023-11-06: qty 100

## 2023-11-06 MED ORDER — LIDOCAINE HCL (CARDIAC) PF 100 MG/5ML IV SOSY
PREFILLED_SYRINGE | INTRAVENOUS | Status: DC | PRN
  Administered 2023-11-06: 100 mg via INTRAVENOUS

## 2023-11-06 MED ORDER — PROPOFOL 10 MG/ML IV BOLUS
INTRAVENOUS | Status: DC | PRN
  Administered 2023-11-06: 100 mg via INTRAVENOUS

## 2023-11-06 NOTE — Interval H&P Note (Signed)
 History and Physical Interval Note:  11/06/2023 10:59 AM  Dustin Brady  has presented today for surgery, with the diagnosis of LUQ ABDOMINAL PAIN,GERD.  The various methods of treatment have been discussed with the patient and family. After consideration of risks, benefits and other options for treatment, the patient has consented to  Procedure(s): ESOPHAGOGASTRODUODENOSCOPY (EGD) WITH PROPOFOL (N/A) as a surgical intervention.  The patient's history has been reviewed, patient examined, no change in status, stable for surgery.  I have reviewed the patient's chart and labs.  Questions were answered to the patient's satisfaction.     Regis Bill  Ok to proceed with EGD

## 2023-11-06 NOTE — H&P (Signed)
 Outpatient short stay form Pre-procedure 11/06/2023  Regis Bill, MD  Primary Physician: Alba Cory, MD  Reason for visit:  LUQ pain  History of present illness:    53 y/o gentleman with history of RA and GERD here for EGD for LUQ pain. No blood thinners. No family history of GI malignancies. No significant neck or abdominal surgeries.    Current Facility-Administered Medications:    0.9 %  sodium chloride infusion, , Intravenous, Continuous, Na Waldrip, Rossie Muskrat, MD, Last Rate: 20 mL/hr at 11/06/23 0942, 20 mL/hr at 11/06/23 0942  Medications Prior to Admission  Medication Sig Dispense Refill Last Dose/Taking   allopurinol (ZYLOPRIM) 100 MG tablet TAKE 1 TABLET BY MOUTH EVERY DAY 90 tablet 0 11/05/2023   cetirizine (ZYRTEC) 10 MG tablet Take 1 tablet (10 mg total) by mouth daily. 90 tablet 3 11/05/2023   Cholecalciferol (VITAMIN D) 50 MCG (2000 UT) CAPS Take 1 capsule (2,000 Units total) by mouth daily. 30 capsule 0 11/05/2023   cyclobenzaprine (FLEXERIL) 10 MG tablet Take 10 mg by mouth 2 (two) times daily as needed.   11/05/2023   fluticasone (FLONASE) 50 MCG/ACT nasal spray SPRAY 2 SPRAYS INTO EACH NOSTRIL EVERY DAY 48 mL 0 11/05/2023   folic acid (FOLVITE) 1 MG tablet Take 1 mg by mouth daily.   11/05/2023   methotrexate (RHEUMATREX) 2.5 MG tablet SMARTSIG:5 Tablet(s) By Mouth Once a Week   11/05/2023   pantoprazole (PROTONIX) 20 MG tablet Take 1 tablet (20 mg total) by mouth daily. 90 tablet 3 11/05/2023   sildenafil (VIAGRA) 100 MG tablet Take 0.5-1 tablets (50-100 mg total) by mouth daily as needed for erectile dysfunction. 90 tablet 0 Past Month     Allergies  Allergen Reactions   Celebrex [Celecoxib] Other (See Comments)    Felt dehydrated and dry mouth      Past Medical History:  Diagnosis Date   Chronic reflux esophagitis 10/05/2016   Gastritis 10/05/2016   Mild to moderate chronic gastritis, EGD Jan 2018, Dr. Marva Panda   GERD (gastroesophageal reflux disease)      Review of systems:  Otherwise negative.    Physical Exam  Gen: Alert, oriented. Appears stated age.  HEENT: PERRLA. Lungs: No respiratory distress CV: RRR Abd: soft, benign, no masses Ext: No edema    Planned procedures: Proceed with EGD. The patient understands the nature of the planned procedure, indications, risks, alternatives and potential complications including but not limited to bleeding, infection, perforation, damage to internal organs and possible oversedation/side effects from anesthesia. The patient agrees and gives consent to proceed.  Please refer to procedure notes for findings, recommendations and patient disposition/instructions.     Regis Bill, MD [facility]

## 2023-11-06 NOTE — Transfer of Care (Signed)
 Immediate Anesthesia Transfer of Care Note  Patient: Dustin Brady  Procedure(s) Performed: ESOPHAGOGASTRODUODENOSCOPY (EGD) WITH PROPOFOL  Patient Location: PACU  Anesthesia Type:General  Level of Consciousness: awake and sedated  Airway & Oxygen Therapy: Patient Spontanous Breathing and Patient connected to nasal cannula oxygen  Post-op Assessment: Report given to RN and Post -op Vital signs reviewed and stable  Post vital signs: Reviewed and stable  Last Vitals:  Vitals Value Taken Time  BP    Temp    Pulse    Resp    SpO2      Last Pain:  Vitals:   11/06/23 0929  TempSrc: Temporal  PainSc: 0-No pain         Complications: There were no known notable events for this encounter.

## 2023-11-06 NOTE — Anesthesia Preprocedure Evaluation (Addendum)
 Anesthesia Evaluation  Patient identified by MRN, date of birth, ID band Patient awake    Reviewed: Allergy & Precautions, H&P , NPO status , Patient's Chart, lab work & pertinent test results  Airway Mallampati: II  TM Distance: >3 FB Neck ROM: full    Dental no notable dental hx.    Pulmonary sleep apnea    Pulmonary exam normal        Cardiovascular negative cardio ROS Normal cardiovascular exam     Neuro/Psych negative neurological ROS  negative psych ROS   GI/Hepatic Neg liver ROS,GERD  ,,  Endo/Other  negative endocrine ROS    Renal/GU negative Renal ROS  negative genitourinary   Musculoskeletal   Abdominal  (+) + obese  Peds  Hematology negative hematology ROS (+)   Anesthesia Other Findings Past Medical History: 10/05/2016: Chronic reflux esophagitis 10/05/2016: Gastritis     Comment:  Mild to moderate chronic gastritis, EGD Jan 2018, Dr.               Marva Panda No date: GERD (gastroesophageal reflux disease)  Past Surgical History: No date: COLONOSCOPY No date: ESOPHAGOGASTRODUODENOSCOPY 11/07/2016: ESOPHAGOGASTRODUODENOSCOPY (EGD) WITH PROPOFOL; N/A     Comment:  Procedure: ESOPHAGOGASTRODUODENOSCOPY (EGD) WITH               PROPOFOL;  Surgeon: Christena Deem, MD;  Location:               [facility] ENDOSCOPY;  Service: Endoscopy;  Laterality: N/A;  BMI    Body Mass Index: 33.75 kg/m      Reproductive/Obstetrics negative OB ROS                             Anesthesia Physical Anesthesia Plan  ASA: 2  Anesthesia Plan: General   Post-op Pain Management:    Induction: Intravenous  PONV Risk Score and Plan: Propofol infusion and TIVA  Airway Management Planned: Natural Airway  Additional Equipment:   Intra-op Plan:   Post-operative Plan:   Informed Consent: I have reviewed the patients History and Physical, chart, labs and discussed the procedure including the  risks, benefits and alternatives for the proposed anesthesia with the patient or authorized representative who has indicated his/her understanding and acceptance.     Dental Advisory Given  Plan Discussed with: CRNA and Surgeon  Anesthesia Plan Comments:         Anesthesia Quick Evaluation

## 2023-11-06 NOTE — Op Note (Signed)
 [facility] Patient Name: Dustin Brady Procedure Date: 11/06/2023 10:46 AM MRN: [id number] Account #: [id number] Date of Birth: [birth year] Admit Type: Outpatient Age: 53 Room: [facility] ENDO ROOM 3 Gender: Male Note Status: Finalized Instrument Name: Upper Endoscope [id number] Procedure:             Upper GI endoscopy Indications:           Abdominal pain in the left upper quadrant,                         Gastro-esophageal reflux disease Providers:             Eather Colas MD, MD Referring MD:          Onnie Boer. Sowles, MD (Referring MD) Medicines:             Monitored Anesthesia Care Complications:         No immediate complications. Estimated blood loss:                         Minimal. Procedure:             Pre-Anesthesia Assessment:                        - Prior to the procedure, a History and Physical was                         performed, and patient medications and allergies were                         reviewed. The patient is competent. The risks and                         benefits of the procedure and the sedation options and                         risks were discussed with the patient. All questions                         were answered and informed consent was obtained.                         Patient identification and proposed procedure were                         verified by the physician, the nurse, the                         anesthesiologist, the anesthetist and the technician                         in the endoscopy suite. Mental Status Examination:                         alert and oriented. Airway Examination: normal                         oropharyngeal airway and neck mobility. Respiratory  Examination: clear to auscultation. CV Examination:                         normal. Prophylactic Antibiotics: The patient does not                         require prophylactic antibiotics. Prior                          Anticoagulants: The patient has taken no anticoagulant                         or antiplatelet agents. ASA Grade Assessment: II - A                         patient with mild systemic disease. After reviewing                         the risks and benefits, the patient was deemed in                         satisfactory condition to undergo the procedure. The                         anesthesia plan was to use monitored anesthesia care                         (MAC). Immediately prior to administration of                         medications, the patient was re-assessed for adequacy                         to receive sedatives. The heart rate, respiratory                         rate, oxygen saturations, blood pressure, adequacy of                         pulmonary ventilation, and response to care were                         monitored throughout the procedure. The physical                         status of the patient was re-assessed after the                         procedure.                        After obtaining informed consent, the endoscope was                         passed under direct vision. Throughout the procedure,                         the patient's blood pressure, pulse, and oxygen  saturations were monitored continuously. The Endoscope                         was introduced through the mouth, and advanced to the                         second part of duodenum. The upper GI endoscopy was                         accomplished without difficulty. The patient tolerated                         the procedure well. Findings:      A small hiatal hernia was present.      The exam of the esophagus was otherwise normal.      A single localized small erosion with no bleeding and no stigmata of       recent bleeding was found in the gastric antrum. Biopsies were taken       with a cold forceps for Helicobacter pylori testing. Estimated blood       loss was  minimal.      The examined duodenum was normal. Impression:            - Small hiatal hernia.                        - Erosive gastropathy with no bleeding and no stigmata                         of recent bleeding. Biopsied.                        - Normal examined duodenum. Recommendation:        - Discharge patient to home.                        - Resume previous diet.                        - Continue present medications.                        - Await pathology results.                        - Return to referring physician as previously                         scheduled. Procedure Code(s):     --- Professional ---                        [phone], Esophagogastroduodenoscopy, flexible,                         transoral; with biopsy, single or multiple Diagnosis Code(s):     --- Professional ---                        K44.9, Diaphragmatic hernia without obstruction or  gangrene                        K31.89, Other diseases of stomach and duodenum                        R10.12, Left upper quadrant pain                        K21.9, Gastro-esophageal reflux disease without                         esophagitis CPT copyright 2022 American Medical Association. All rights reserved. The codes documented in this report are preliminary and upon coder review may  be revised to meet current compliance requirements. Eather Colas MD, MD 11/06/2023 11:15:19 AM Number of Addenda: 0 Note Initiated On: 11/06/2023 10:46 AM Estimated Blood Loss:  Estimated blood loss was minimal.      [facility]

## 2023-11-07 LAB — SURGICAL PATHOLOGY

## 2023-11-07 NOTE — Anesthesia Postprocedure Evaluation (Signed)
 Anesthesia Post Note  Patient: Dustin Brady  Procedure(s) Performed: ESOPHAGOGASTRODUODENOSCOPY (EGD) WITH PROPOFOL  Patient location during evaluation: Endoscopy Anesthesia Type: General Level of consciousness: awake and alert Pain management: pain level controlled Vital Signs Assessment: post-procedure vital signs reviewed and stable Respiratory status: spontaneous breathing, nonlabored ventilation and respiratory function stable Cardiovascular status: blood pressure returned to baseline and stable Postop Assessment: no apparent nausea or vomiting Anesthetic complications: no   There were no known notable events for this encounter.   Last Vitals:  Vitals:   11/06/23 1116 11/06/23 1126  BP: 115/66 129/79  Pulse: 86 69  Resp: 17 19  Temp: (!) 35.6 C   SpO2: 98% 97%    Last Pain:  Vitals:   11/07/23 0737  TempSrc:   PainSc: 2                  Foye Deer

## 2023-11-07 NOTE — Anesthesia Postprocedure Evaluation (Signed)
 Anesthesia Post Note  Patient: Jacquan Savas  Procedure(s) Performed: ESOPHAGOGASTRODUODENOSCOPY (EGD) WITH PROPOFOL  Patient location during evaluation: Endoscopy Anesthesia Type: General Level of consciousness: awake and alert Pain management: pain level controlled Vital Signs Assessment: post-procedure vital signs reviewed and stable Respiratory status: spontaneous breathing, nonlabored ventilation and respiratory function stable Cardiovascular status: blood pressure returned to baseline and stable Postop Assessment: no apparent nausea or vomiting Anesthetic complications: no   There were no known notable events for this encounter.   Last Vitals:  Vitals:   11/06/23 1116 11/06/23 1126  BP: 115/66 129/79  Pulse: 86 69  Resp: 17 19  Temp: (!) 35.6 C   SpO2: 98% 97%    Last Pain:  Vitals:   11/07/23 0737  TempSrc:   PainSc: 2                  Foye Deer

## 2023-12-20 ENCOUNTER — Ambulatory Visit (INDEPENDENT_AMBULATORY_CARE_PROVIDER_SITE_OTHER): Payer: Self-pay | Admitting: Family Medicine

## 2023-12-20 ENCOUNTER — Encounter: Payer: Self-pay | Admitting: Family Medicine

## 2023-12-20 VITALS — BP 120/84 | HR 85 | Resp 16 | Ht 68.0 in | Wt 227.2 lb

## 2023-12-20 DIAGNOSIS — E785 Hyperlipidemia, unspecified: Secondary | ICD-10-CM

## 2023-12-20 DIAGNOSIS — Z0000 Encounter for general adult medical examination without abnormal findings: Secondary | ICD-10-CM

## 2023-12-20 DIAGNOSIS — L918 Other hypertrophic disorders of the skin: Secondary | ICD-10-CM | POA: Diagnosis not present

## 2023-12-20 DIAGNOSIS — R7303 Prediabetes: Secondary | ICD-10-CM

## 2023-12-20 DIAGNOSIS — K3189 Other diseases of stomach and duodenum: Secondary | ICD-10-CM | POA: Insufficient documentation

## 2023-12-20 DIAGNOSIS — K449 Diaphragmatic hernia without obstruction or gangrene: Secondary | ICD-10-CM | POA: Insufficient documentation

## 2023-12-20 NOTE — Progress Notes (Signed)
 Name: Dustin Brady   MRN: [id number]    DOB: 1971-06-22   Date:12/20/2023       Progress Note  Subjective  Chief Complaint  Chief Complaint  Patient presents with   Annual Exam    HPI  Patient presents for annual CPE .  Discussed the use of AI scribe software for clinical note transcription with the patient, who gave verbal consent to proceed.  History of Present Illness Dustin Brady is a 53 year old male who presents for an annual physical exam.  He has rheumatoid arthritis managed by a rheumatologist. He experiences fluctuating joint symptoms, which have improved with methotrexate, though stiffness persists. Methotrexate dosage and frequency are not specified.  He has a history of erosive gastropathy and a hiatal hernia, identified during an upper GI procedure last month due to burning abdominal sensations. No bleeding is associated with the gastropathy.  His diet consists of home-cooked meals with occasional dining out on weekends. He consumes vegetables and some seafood, limits fried foods, and avoids nuts. He is not a big fruit eater.  He has skin tags on his neck that are irritating due to friction with clothing and seatbelt. He has had them removed before and finds them bothersome.  He has a history of prediabetes, with an A1c in the prediabetic range last year. He is due for a recheck of his A1c and lipid panel as part of his physical exam.  No chest pain, palpitations, or significant changes in overall health. He has never smoked and has no significant family history of prostate cancer.       IPSS     Row Name 12/20/23 0912         International Prostate Symptom Score   How often have you had the sensation of not emptying your bladder? Not at All     How often have you had to urinate less than every two hours? Not at All     How often have you found you stopped and started again several times when you urinated? Not at All     How often have you found it difficult to  postpone urination? Not at All     How often have you had a weak urinary stream? Not at All     How often have you had to strain to start urination? Not at All     How many times did you typically get up at night to urinate? 1 Time     Total IPSS Score 1       Quality of Life due to urinary symptoms   If you were to spend the rest of your life with your urinary condition just the way it is now how would you feel about that? Mostly Satisfied              Diet: mostly eats at home.  Exercise: continue regular physical activity  Last Dental Exam: up to date  Last Eye Exam: up to date   Depression: phq 9 is negative    12/20/2023    9:10 AM 07/06/2023   10:41 AM 04/06/2023    9:24 AM 01/18/2023   11:25 AM 12/14/2022   11:11 AM  Depression screen PHQ 2/9  Decreased Interest 0 0 0 0 0  Down, Depressed, Hopeless 0 0 0 0 0  PHQ - 2 Score 0 0 0 0 0  Altered sleeping  0  0 0  Tired, decreased energy  0  0 0  Change in appetite  0  0 0  Feeling bad or failure about yourself   0  0 0  Trouble concentrating  0  0 0  Moving slowly or fidgety/restless  0  0 0  Suicidal thoughts  0  0 0  PHQ-9 Score  0  0 0  Difficult doing work/chores    Not difficult at all     Hypertension:  BP Readings from Last 3 Encounters:  12/20/23 120/84  11/06/23 129/79  07/06/23 118/74    Obesity: Wt Readings from Last 3 Encounters:  12/20/23 227 lb 3.2 oz (103.1 kg)  11/06/23 222 lb (100.7 kg)  07/06/23 229 lb 6.4 oz (104.1 kg)   BMI Readings from Last 3 Encounters:  12/20/23 34.55 kg/m  11/06/23 33.75 kg/m  07/06/23 34.88 kg/m     [employer] Visit from 12/20/2023 in [facility]  AUDIT-C Score 0        Married STD testing and prevention (HIV/chl/gon/syphilis): No Sexual history: married, one partner  Hep C Screening: completed Skin cancer: Discussed monitoring for atypical lesions Colorectal cancer: repeat in 2027 Prostate cancer:  not  applicable   Lung cancer:  Low Dose CT Chest recommended if Age 76-80 years, 30 pack-year currently smoking OR have quit w/in 15years. Patient  is not a candidate for screening   AAA: The USPSTF recommends one-time screening with ultrasonography in men ages 34 to 75 years who have ever smoked. Patient   is not a candidate for screening  ECG:  recheck next visit   Vaccines: reviewed with the patient.   Advanced Care Planning: A voluntary discussion about advance care planning including the explanation and discussion of advance directives.  Discussed health care proxy and Living will, and the patient was able to identify a health care proxy as wife .  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) 04/06/2023   Other male erectile dysfunction 07/19/2021   Dyslipidemia 07/19/2021   Pre-diabetes 07/19/2021   Elevated uric acid in blood 07/19/2021   Chronic reflux esophagitis 10/05/2016    Past Surgical History:  Procedure Laterality Date   COLONOSCOPY     ESOPHAGOGASTRODUODENOSCOPY     ESOPHAGOGASTRODUODENOSCOPY (EGD) WITH PROPOFOL  N/A 11/07/2016   Procedure: ESOPHAGOGASTRODUODENOSCOPY (EGD) WITH PROPOFOL ;  Surgeon: Deveron Fly, MD;  Location: [facility] ENDOSCOPY;  Service: Endoscopy;  Laterality: N/A;   ESOPHAGOGASTRODUODENOSCOPY (EGD) WITH PROPOFOL  N/A 11/06/2023   Procedure: ESOPHAGOGASTRODUODENOSCOPY (EGD) WITH PROPOFOL ;  Surgeon: Shane Darling, MD;  Location: [facility] ENDOSCOPY;  Service: Endoscopy;  Laterality: N/A;    Family History  Problem Relation Age of Onset   Hypertension Father    Diabetes Father    Stroke Father    Cancer Other     Social History   Socioeconomic History   Marital status: Married    Spouse name: Not on file    Number of children: 3   Years of education: Not on file   Highest education level: 12th grade  Occupational History   Occupation: driver - trucks     Comment: [employer]  Tobacco Use   Smoking status: Never   Smokeless tobacco: Never  Vaping Use   Vaping status: Never Used  Substance and Sexual Activity   Alcohol  use: No   Drug use: No   Sexual activity: Yes    Partners: Female    Comment: wife had hysterectomy   Other Topics Concern   Not on file  Social History Narrative   Not on file   Social Drivers of Health   Financial Resource Strain: Low Risk  (12/20/2023)   Overall Financial Resource Strain (CARDIA)    Difficulty of Paying Living Expenses: Not hard at all  Food Insecurity: No Food Insecurity (12/20/2023)   Hunger Vital Sign    Worried About Running Out of Food in the Last Year: Never true    Ran Out of Food in the Last Year: Never true  Transportation Needs: No Transportation Needs (12/20/2023)   PRAPARE - Administrator, Civil Service (Medical): No    Lack of Transportation (Non-Medical): No  Physical Activity: Sufficiently Active (12/20/2023)   Exercise Vital Sign    Days of Exercise per Week: 5 days    Minutes of Exercise per Session: 80 min  Stress: No Stress Concern Present (12/20/2023)   [employer] of Occupational Health - Occupational Stress Questionnaire    Feeling of Stress : Only a little  Social Connections: Socially Integrated (12/20/2023)   Social Connection and Isolation Panel [NHANES]    Frequency of Communication with Friends and Family: More than three times a week    Frequency of Social Gatherings with Friends and Family: More than three times a week    Attends Religious Services: More than 4 times per year    Active Member of [employer] or Organizations: Yes    Attends Banker Meetings: 1 to 4 times per year    Marital Status: Married  Catering manager Violence: Not At Risk (12/20/2023)   Humiliation, Afraid, Rape, and Kick  questionnaire    Fear of Current or Ex-Partner: No    Emotionally Abused: No    Physically Abused: No    Sexually Abused: No     Current Outpatient Medications:    allopurinol  (ZYLOPRIM ) 300 MG tablet, Take 300 mg by mouth daily., Disp: , Rfl:    cetirizine  (ZYRTEC ) 10 MG tablet, Take 1 tablet (10 mg total) by mouth daily., Disp: 90 tablet, Rfl: 3   cyclobenzaprine (FLEXERIL) 10 MG tablet, Take 10 mg by mouth 2 (two) times daily as needed., Disp: , Rfl:    fluticasone  (FLONASE ) 50 MCG/ACT nasal spray, SPRAY 2 SPRAYS INTO EACH NOSTRIL EVERY DAY, Disp: 48 mL, Rfl: 0   folic acid (FOLVITE) 1 MG tablet, Take 1 mg by mouth daily., Disp: , Rfl:    methotrexate (RHEUMATREX) 2.5 MG tablet, SMARTSIG:5 Tablet(s) By Mouth Once a Week, Disp: , Rfl:    pantoprazole  (PROTONIX ) 20 MG tablet, Take 1 tablet (20 mg total) by mouth daily., Disp: 90 tablet, Rfl: 3   sildenafil  (VIAGRA ) 100 MG tablet, Take 0.5-1 tablets (50-100 mg total) by mouth daily as needed for erectile dysfunction., Disp: 90 tablet, Rfl: 0   allopurinol  (ZYLOPRIM ) 100 MG tablet, TAKE 1 TABLET BY MOUTH EVERY DAY (Patient not taking: Reported on 12/20/2023), Disp: 90 tablet, Rfl: 0   Cholecalciferol (VITAMIN D ) 50 MCG (2000 UT) CAPS, Take 1 capsule (2,000 Units total) by mouth daily. (Patient not taking: Reported on 12/20/2023), Disp: 30 capsule, Rfl: 0  Allergies  Allergen Reactions   Celebrex [Celecoxib] Other (See Comments)    Felt dehydrated and dry mouth      ROS  Constitutional: Negative for fever or weight change.  Respiratory:  Negative for cough and shortness of breath.   Cardiovascular: Negative for chest pain or palpitations.  Gastrointestinal: Negative for abdominal pain, no bowel changes.  Musculoskeletal: Negative for gait problem or joint swelling.  Skin: Negative for rash.  Neurological: Negative for dizziness or headache.  No other specific complaints in a complete review of systems (except as listed in HPI above).     Objective  Vitals:   12/20/23 0913  BP: 120/84  Pulse: 85  Resp: 16  SpO2: 97%  Weight: 227 lb 3.2 oz (103.1 kg)  Height: 5\' 8"  (1.727 m)    Body mass index is 34.55 kg/m.  Physical Exam  Constitutional: Patient appears well-developed and well-nourished. No distress.  HENT: Head: Normocephalic and atraumatic. Ears: B TMs ok, no erythema or effusion; Nose: Nose normal. Mouth/Throat: Oropharynx is clear and moist. No oropharyngeal exudate.  Eyes: Conjunctivae and EOM are normal. Pupils are equal, round, and reactive to light. No scleral icterus.  Neck: Normal range of motion. Neck supple. No JVD present. No thyromegaly present.  Cardiovascular: Normal rate, regular rhythm and normal heart sounds.  No murmur heard. No BLE edema. Pulmonary/Chest: Effort normal and breath sounds normal. No respiratory distress. Abdominal: Soft. Bowel sounds are normal, no distension. There is no tenderness. no masses MALE GENITALIA: Normal descended testes bilaterally, no masses palpated, no hernias, no lesions, no discharge RECTAL: not done  Musculoskeletal: Normal range of motion, no joint effusions. No gross deformities Neurological: he is alert and oriented to Brady, place, and time. No cranial nerve deficit. Coordination, balance, strength, speech and gait are normal.  Skin: Skin is warm and dry. No rash noted. No erythema. Skin tags on neck Psychiatric: Patient has a normal mood and affect. behavior is normal. Judgment and thought content normal.      Assessment & Plan Wellness Visit Routine wellness visit conducted. Discussed dietary modifications, lab work, screenings, and power of attorney. Vaccinations current except for uncertain hepatitis A and B status. - Order A1c, lipid panel, and CMP. - Schedule EKG at next visit for baseline. - Review immunization records for hepatitis A and B. - Discuss power of attorney for healthcare decisions.  Prediabetes Previous A1c indicated  prediabetes. Plan to recheck A1c to monitor status. - Order A1c.  Rheumatoid arthritis Managed by rheumatologist Dr. Lydia Sams. Currently on methotrexate. Symptoms include intermittent stiffness. - Continue management with Dr. Lydia Sams. - Avoid checking uric acid to prevent duplicate testing.  Erosive gastropathy Erosive gastropathy diagnosed without bleeding. Managed with medication adjustments.  Hiatal hernia Hiatal hernia diagnosed with normal esophagus. Managed with medication adjustments.  Skin tags Multiple skin tags on the neck causing irritation. - Schedule appointment for removal of up to 15 skin tags.    -Prostate cancer screening and PSA options (with potential risks and benefits of testing vs not testing) were discussed along with recent recs/guidelines. -USPSTF grade A and B recommendations reviewed with patient; age-appropriate recommendations, preventive care, screening tests, etc discussed and encouraged; healthy living encouraged; see AVS for patient education given to patient -Discussed importance of 150 minutes of physical activity weekly, eat two servings of fish weekly, eat one serving of tree nuts ( cashews, pistachios, pecans, almonds.Aaron Aas) every other day, eat 6 servings of fruit/vegetables daily and drink plenty of water and avoid sweet beverages.  -Reviewed Health Maintenance: yes

## 2023-12-21 ENCOUNTER — Ambulatory Visit: Admitting: Family Medicine

## 2023-12-21 ENCOUNTER — Encounter: Payer: Self-pay | Admitting: Family Medicine

## 2023-12-21 VITALS — BP 118/80 | HR 94 | Resp 16 | Ht 68.0 in | Wt 225.2 lb

## 2023-12-21 DIAGNOSIS — D84821 Immunodeficiency due to drugs: Secondary | ICD-10-CM

## 2023-12-21 DIAGNOSIS — G4733 Obstructive sleep apnea (adult) (pediatric): Secondary | ICD-10-CM | POA: Diagnosis not present

## 2023-12-21 DIAGNOSIS — R7303 Prediabetes: Secondary | ICD-10-CM

## 2023-12-21 DIAGNOSIS — E785 Hyperlipidemia, unspecified: Secondary | ICD-10-CM | POA: Diagnosis not present

## 2023-12-21 DIAGNOSIS — M0609 Rheumatoid arthritis without rheumatoid factor, multiple sites: Secondary | ICD-10-CM | POA: Diagnosis not present

## 2023-12-21 DIAGNOSIS — G729 Myopathy, unspecified: Secondary | ICD-10-CM

## 2023-12-21 DIAGNOSIS — I4510 Unspecified right bundle-branch block: Secondary | ICD-10-CM | POA: Diagnosis not present

## 2023-12-21 DIAGNOSIS — Z79899 Other long term (current) drug therapy: Secondary | ICD-10-CM

## 2023-12-21 DIAGNOSIS — N528 Other male erectile dysfunction: Secondary | ICD-10-CM

## 2023-12-21 LAB — COMPREHENSIVE METABOLIC PANEL WITH GFR
AG Ratio: 1.6 (calc) (ref 1.0–2.5)
ALT: 35 U/L (ref 9–46)
AST: 25 U/L (ref 10–35)
Albumin: 4.5 g/dL (ref 3.6–5.1)
Alkaline phosphatase (APISO): 42 U/L (ref 35–144)
BUN: 17 mg/dL (ref 7–25)
CO2: 30 mmol/L (ref 20–32)
Calcium: 10.1 mg/dL (ref 8.6–10.3)
Chloride: 101 mmol/L (ref 98–110)
Creat: 1.25 mg/dL (ref 0.70–1.30)
Globulin: 2.9 g/dL (ref 1.9–3.7)
Glucose, Bld: 91 mg/dL (ref 65–99)
Potassium: 4.5 mmol/L (ref 3.5–5.3)
Sodium: 139 mmol/L (ref 135–146)
Total Bilirubin: 0.4 mg/dL (ref 0.2–1.2)
Total Protein: 7.4 g/dL (ref 6.1–8.1)
eGFR: 70 mL/min/{1.73_m2} (ref >=60)

## 2023-12-21 LAB — LIPID PANEL
Cholesterol: 183 mg/dL (ref <200)
HDL: 33 mg/dL — ABNORMAL LOW (ref >=40)
LDL Cholesterol (Calc): 125 mg/dL — ABNORMAL HIGH
Non-HDL Cholesterol (Calc): 150 mg/dL — ABNORMAL HIGH (ref <130)
Total CHOL/HDL Ratio: 5.5 (calc) — ABNORMAL HIGH (ref <5.0)
Triglycerides: 134 mg/dL (ref <150)

## 2023-12-21 LAB — HEMOGLOBIN A1C
Hgb A1c MFr Bld: 6.2 % — ABNORMAL HIGH (ref <5.7)
Mean Plasma Glucose: 131 mg/dL
eAG (mmol/L): 7.3 mmol/L

## 2023-12-21 MED ORDER — METFORMIN HCL ER 500 MG PO TB24: 500.0000 mg | ORAL_TABLET | Freq: Every day | ORAL | 1 refills | Status: DC

## 2023-12-21 MED ORDER — SILDENAFIL CITRATE 100 MG PO TABS: 50.0000 mg | ORAL_TABLET | Freq: Every day | ORAL | 0 refills | Status: DC | PRN

## 2023-12-21 NOTE — Progress Notes (Signed)
 Name: Dustin Brady   MRN: [id number]    DOB: 15-Oct-1970   Date:12/21/2023       Progress Note  Subjective  Chief Complaint  Chief Complaint  Patient presents with   Medical Management of Chronic Issues   Discussed the use of AI scribe software for clinical note transcription with the patient, who gave verbal consent to proceed.  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 He is not currently on any specific treatment for myopathy. His daughter is also under Dr. Francena Infield care, suggesting a possible familial component.  He has dyslipidemia, with recent lab results showing an LDL of 125 mg/dL and an HDL of 30 mg/dL. He manages his cholesterol through dietary modifications and is not on any cholesterol-lowering medications. No chest pain, shortness of breath, or chest tightness.   He has seronegative rheumatoid arthritis and is on methotrexate, which has significantly improved his joint pain. Previously, joint swelling and fatigue prevented him from working, but he reports improvement and is back to full time hours with some activity modifications   He has controlled gout and is currently on allopurinol  300 mg, with no recent gout attacks.   For gastroesophageal reflux disease (GERD), he takes pantoprazole  20 mg, which controls his symptoms unless he consumes very spicy foods.  He experiences erectile dysfunction and uses Viagra . He also takes folic acid but is not currently on vitamin D  supplementation.   His A1c has been trending upwards over the past four years, currently at 6.2%, indicating prediabetes. He is considering dietary changes to manage his blood sugar levels.   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   Elevated CK 04/06/2023   Rheumatoid arthritis of multiple sites without rheumatoid factor (HCC) 04/06/2023   Other male erectile dysfunction 07/19/2021   Dyslipidemia 07/19/2021   Pre-diabetes 07/19/2021   Elevated uric acid in blood 07/19/2021   Chronic reflux esophagitis 10/05/2016    Past Surgical History:  Procedure Laterality Date   COLONOSCOPY     ESOPHAGOGASTRODUODENOSCOPY     ESOPHAGOGASTRODUODENOSCOPY (EGD) WITH PROPOFOL  N/A 11/07/2016   Procedure: ESOPHAGOGASTRODUODENOSCOPY (EGD) WITH PROPOFOL ;  Surgeon: Deveron Fly, MD;  Location: [facility] ENDOSCOPY;  Service: Endoscopy;  Laterality: N/A;   ESOPHAGOGASTRODUODENOSCOPY (EGD) WITH PROPOFOL  N/A 11/06/2023   Procedure: ESOPHAGOGASTRODUODENOSCOPY (EGD) WITH PROPOFOL ;  Surgeon: Shane Darling, MD;  Location: [facility] ENDOSCOPY;  Service: Endoscopy;  Laterality: N/A;    Family History  Problem Relation Age of Onset   Hypertension Father    Diabetes Father  Stroke Father    Cancer Other     Social History   Tobacco Use   Smoking status: Never   Smokeless tobacco: Never  Substance Use Topics   Alcohol use: No     Current Outpatient Medications:    allopurinol  (ZYLOPRIM ) 300 MG tablet, Take 300 mg by mouth daily., Disp: , Rfl:    cetirizine  (ZYRTEC ) 10 MG tablet, Take 1 tablet (10 mg total) by mouth daily., Disp: 90 tablet, Rfl: 3   fluticasone  (FLONASE ) 50 MCG/ACT nasal spray, SPRAY 2 SPRAYS  INTO EACH NOSTRIL EVERY DAY, Disp: 48 mL, Rfl: 0   folic acid (FOLVITE) 1 MG tablet, Take 1 mg by mouth daily., Disp: , Rfl:    metFORMIN (GLUCOPHAGE-XR) 500 MG 24 hr tablet, Take 1 tablet (500 mg total) by mouth daily with breakfast., Disp: 90 tablet, Rfl: 1   methotrexate (RHEUMATREX) 2.5 MG tablet, SMARTSIG:5 Tablet(s) By Mouth Once a Week, Disp: , Rfl:    pantoprazole  (PROTONIX ) 20 MG tablet, Take 1 tablet (20 mg total) by mouth daily., Disp: 90 tablet, Rfl: 3   Cholecalciferol (VITAMIN D ) 50 MCG (2000 UT) CAPS, Take 1 capsule (2,000 Units total) by mouth daily. (Patient not taking: Reported on 12/21/2023), Disp: 30 capsule, Rfl: 0   sildenafil  (VIAGRA ) 100 MG tablet, Take 0.5-1 tablets (50-100 mg total) by mouth daily as needed for erectile dysfunction., Disp: 90 tablet, Rfl: 0  Allergies  Allergen Reactions   Sucralfate Nausea Only and Other (See Comments)    DRY MOUTH ,FREQUENT URINATION   Celebrex [Celecoxib] Other (See Comments)    Felt dehydrated and dry mouth     I personally reviewed active problem list, medication list, allergies, family history with the patient/caregiver today.   ROS  Ten systems reviewed and is negative except as mentioned in HPI    Objective Physical Exam Constitutional: Patient appears well-developed and well-nourished. Obese  No distress.  HEENT: head atraumatic, normocephalic, pupils equal and reactive to light, neck supple Cardiovascular: Normal rate, regular rhythm and normal heart sounds.  No murmur heard. No BLE edema. Pulmonary/Chest: Effort normal and breath sounds normal. No respiratory distress. Abdominal: Soft.  There is no tenderness. Muscular skeletal: no synovitis Psychiatric: Patient has a normal mood and affect. behavior is normal. Judgment and thought content normal.   Vitals:   12/21/23 1000  BP: 118/80  Pulse: 94  Resp: 16  SpO2: 98%  Weight: 225 lb 3.2 oz (102.2 kg)  Height: 5\' 8"  (1.727 m)    Body mass index is 34.24  kg/m.  Recent Results (from the past 2160 hours)  Surgical pathology     Status: None   Collection Time: 11/06/23 12:00 AM  Result Value Ref Range   SURGICAL PATHOLOGY      SURGICAL PATHOLOGY [facility] [address], [address] [place], [place] [address] Telephone [phone] or [phone] Fax [phone]  REPORT OF SURGICAL PATHOLOGY   Accession #: [id number] Patient Name: NEESON, SUSMAN Visit # : [id number]  MRN: [id number] Physician: Leida Puna DOB/Age Jan 06, 1971 (Age: 56) Gender: M Collected Date: 11/06/2023 Received Date: 11/06/2023  FINAL DIAGNOSIS       1. Stomach, biopsy, random, cbx :       GASTRIC ANTRAL/OXYNTIC MUCOSA WITH NO SIGNIFICANT DIAGNOSTIC ALTERATION.      NO H. PYLORI IDENTIFIED ON H&E STAIN.      NEGATIVE FOR INTESTINAL METAPLASIA OR DYSPLASIA.       DATE SIGNED OUT: 11/07/2023 ELECTRONIC SIGNATURE : India Mandes  Lu Md, Pathologist, Electronic Signature  MICROSCOPIC DESCRIPTION  CASE COMMENTS STAINS USED IN DIAGNOSIS: H&E    CLINICAL HISTORY  SPECIMEN(S) OBTAINED 1. Stomach, biopsy, Random, Cbx  SPECIMEN COMMENTS: 1. R/O h pylori SPECIMEN CLINICAL INFORMATION:  1. LUQ abdominal pain, GERD    Gross Description 1. Received in formalin is a 1.0 x 0.8 x 0.2 cm aggregate of multiple soft, tan tissue fragments submitted entirely in block 1A.   (SB:kh 11/06/23)        Report signed out from the following location(s) [facility] [address] [place], [place] [address] CLIA #: [id number]  [facility] [address] [place], [place] [address] CLIA #: [id number]   Lipid panel     Status: Abnormal   Collection Time: 12/20/23 10:14 AM  Result Value Ref Range   Cholesterol 183 <200 mg/dL   HDL 33 (L) > OR = 40 mg/dL   Triglycerides 578 <469 mg/dL   LDL Cholesterol (Calc) 125 (H) mg/dL (calc)    Comment: Reference range: <100 . Desirable range <100 mg/dL for  primary prevention;   <70 mg/dL for patients with CHD or diabetic patients  with > or = 2 CHD risk factors. Aaron Aas LDL-C is now calculated using the Martin-Hopkins  calculation, which is a validated novel method providing  better accuracy than the Friedewald equation in the  estimation of LDL-C.  Melinda Sprawls et al. Erroll Heard. 6295;284(13): 2061-2068  (http://education.[employer].com/faq/FAQ164)    Total CHOL/HDL Ratio 5.5 (H) <5.0 (calc)   Non-HDL Cholesterol (Calc) 150 (H) <130 mg/dL (calc)    Comment: For patients with diabetes plus 1 major ASCVD risk  factor, treating to a non-HDL-C goal of <100 mg/dL  (LDL-C of <24 mg/dL) is considered a therapeutic  option.   Hemoglobin A1c     Status: Abnormal   Collection Time: 12/20/23 10:14 AM  Result Value Ref Range   Hgb A1c MFr Bld 6.2 (H) <5.7 %    Comment: For someone without known diabetes, a hemoglobin  A1c value between 5.7% and 6.4% is consistent with prediabetes and should be confirmed with a  follow-up test. . For someone with known diabetes, a value <7% indicates that their diabetes is well controlled. A1c targets should be individualized based on duration of diabetes, age, comorbid conditions, and other considerations. . This assay result is consistent with an increased risk of diabetes. . Currently, no consensus exists regarding use of hemoglobin A1c for diagnosis of diabetes for children. .    Mean Plasma Glucose 131 mg/dL   eAG (mmol/L) 7.3 mmol/L  Comprehensive metabolic panel with GFR     Status: None   Collection Time: 12/20/23 10:14 AM  Result Value Ref Range   Glucose, Bld 91 65 - 99 mg/dL    Comment: .            Fasting reference interval .    BUN 17 7 - 25 mg/dL   Creat 4.01 0.27 - 2.53 mg/dL   eGFR 70 > OR = 60 GU/YQI/3.47Q2   BUN/Creatinine Ratio SEE NOTE: 6 - 22 (calc)    Comment:    Not Reported: BUN and Creatinine are within    reference range. .    Sodium 139 135 - 146 mmol/L   Potassium 4.5  3.5 - 5.3 mmol/L   Chloride 101 98 - 110 mmol/L   CO2 30 20 - 32 mmol/L   Calcium 10.1 8.6 - 10.3 mg/dL   Total Protein 7.4 6.1 -  8.1 g/dL   Albumin 4.5 3.6 - 5.1 g/dL   Globulin 2.9 1.9 - 3.7 g/dL (calc)   AG Ratio 1.6 1.0 - 2.5 (calc)   Total Bilirubin 0.4 0.2 - 1.2 mg/dL   Alkaline phosphatase (APISO) 42 35 - 144 U/L   AST 25 10 - 35 U/L   ALT 35 9 - 46 U/L     PHQ2/9:    12/20/2023    9:10 AM 07/06/2023   10:41 AM 04/06/2023    9:24 AM 01/18/2023   11:25 AM 12/14/2022   11:11 AM  Depression screen PHQ 2/9  Decreased Interest 0 0 0 0 0  Down, Depressed, Hopeless 0 0 0 0 0  PHQ - 2 Score 0 0 0 0 0  Altered sleeping  0  0 0  Tired, decreased energy  0  0 0  Change in appetite  0  0 0  Feeling bad or failure about yourself   0  0 0  Trouble concentrating  0  0 0  Moving slowly or fidgety/restless  0  0 0  Suicidal thoughts  0  0 0  PHQ-9 Score  0  0 0  Difficult doing work/chores    Not difficult at all     phq 9 is negative  Fall Risk:    12/20/2023    9:06 AM 07/06/2023   10:41 AM 04/06/2023    9:24 AM 01/18/2023   11:25 AM 12/14/2022   11:11 AM  Fall Risk   Falls in the past year? 0 0 0 0 0  Number falls in past yr: 0   0 0  Injury with Fall? 0   0 0  Risk for fall due to : No Fall Risks No Fall Risks No Fall Risks No Fall Risks No Fall Risks  Follow up Falls prevention discussed;Education provided;Falls evaluation completed Falls prevention discussed  Falls prevention discussed;Education provided;Falls evaluation completed Falls prevention discussed     Assessment & Plan Dyslipidemia Elevated LDL at 125 mg/dL, low HDL at 30 mg/dL. Low cardiovascular risk at 5.3% over ten years. Managed with dietary modifications. - Encourage fish consumption twice weekly to increase HDL. - Advise moderate intake of tree nuts. - Consider cholesterol medication if cardiovascular risk exceeds 10%.  Prediabetes A1c at 6.2%, indicating progression towards diabetes. Discussed metformin  initiation and dietary changes. Explained metformin side effects. - Prescribe metformin 500 mg daily. - Advise reducing intake of bread, pasta, rice, chips, desserts, and sweet beverages. - Schedule follow-up in six months to recheck A1c.  Gastroesophageal reflux disease (GERD) Well-controlled with pantoprazole  20 mg daily. Symptoms only with spicy foods.  Erectile dysfunction Managed with Viagra . Requests continuation. - Prescribe Viagra .  Seronegative rheumatoid arthritis Managed with methotrexate, improving symptoms. Discussed infection risk due to immunosuppression. - Continue methotrexate. - Advise to report signs of infection such as fever and chills.  Gout Managed with allopurinol  300 mg. No recent attacks.  Myopathy with elevated CK Referral to neuromuscular clinic for further evaluation and potential familial connection. - Refer to Dr. Ardell Koller at the [facility] at [facility].  Immunosuppression due to drug therapy -methotrexate -Discussed importance of reaching out if sick  Right bundle branch block Identified on EKG, considered normal variant. No symptoms. Discussed monitoring for new symptoms. - Monitor for new symptoms such as chest pain or shortness of breath.

## 2023-12-22 ENCOUNTER — Encounter (HOSPITAL_COMMUNITY): Payer: Self-pay

## 2023-12-29 ENCOUNTER — Encounter: Payer: Self-pay | Admitting: Family Medicine

## 2023-12-29 ENCOUNTER — Ambulatory Visit: Admitting: Family Medicine

## 2023-12-29 VITALS — BP 114/62 | HR 92 | Temp 98.0°F | Ht 68.0 in | Wt 221.9 lb

## 2023-12-29 DIAGNOSIS — L918 Other hypertrophic disorders of the skin: Secondary | ICD-10-CM | POA: Diagnosis not present

## 2023-12-29 DIAGNOSIS — G729 Myopathy, unspecified: Secondary | ICD-10-CM | POA: Diagnosis not present

## 2023-12-29 MED ORDER — LIDOCAINE HCL (PF) 1 % IJ SOLN
2.0000 mL | Freq: Once | INTRAMUSCULAR | Status: AC
  Administered 2023-12-29: 2 mL

## 2023-12-29 NOTE — Patient Instructions (Signed)
 [facility]    P: [phone] F [phone]   Release ID # [id number]

## 2023-12-29 NOTE — Progress Notes (Signed)
 Name: Dustin Brady   MRN: [id number]    DOB: 10-06-1970   Date:12/29/2023       Progress Note  Subjective  Chief Complaint  Chief Complaint  Patient presents with   skin tag removal    Located on L and R sides of neck    Discussed the use of AI scribe software for clinical note transcription with the patient, who gave verbal consent to proceed.  History of Present Illness Dustin Brady is a 53 year old male who presents with skin tags on his neck causing irritation.  He has multiple small skin tags on his neck that are causing irritation, particularly when caught on his work uniform collar or seatbelt. The skin tags are described as 'very small' and are the only ones causing irritation, as he has no skin tags under his arms.  He recalls a previous treatment where the skin tags were removed by burning, but he does not remember if numbing was used.   He is concerned about the cost of sending the skin tags to pathology. The skin tags on his neck are not changing color.  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   Elevated CK 04/06/2023   Rheumatoid arthritis of multiple sites without rheumatoid factor (HCC) 04/06/2023   Other male erectile dysfunction 07/19/2021   Dyslipidemia 07/19/2021   Pre-diabetes 07/19/2021   Elevated uric acid in blood 07/19/2021   Chronic reflux esophagitis 10/05/2016    Social History   Tobacco Use   Smoking status: Never   Smokeless tobacco: Never  Substance Use Topics   Alcohol use: No     Current Outpatient Medications:    allopurinol  (ZYLOPRIM ) 300 MG tablet, Take 300 mg by mouth daily.,  Disp: , Rfl:    cetirizine  (ZYRTEC ) 10 MG tablet, Take 1 tablet (10 mg total) by mouth daily., Disp: 90 tablet, Rfl: 3   fluticasone  (FLONASE ) 50 MCG/ACT nasal spray, SPRAY 2 SPRAYS INTO EACH NOSTRIL EVERY DAY, Disp: 48 mL, Rfl: 0   folic acid (FOLVITE) 1 MG tablet, Take 1 mg by mouth daily., Disp: , Rfl:    metFORMIN  (GLUCOPHAGE -XR) 500 MG 24 hr tablet, Take 1 tablet (500 mg total) by mouth daily with breakfast., Disp: 90 tablet, Rfl: 1   methotrexate (RHEUMATREX) 2.5 MG tablet, SMARTSIG:5 Tablet(s) By Mouth Once a Week, Disp: , Rfl:    pantoprazole  (PROTONIX ) 20 MG tablet, Take 1 tablet (20 mg total) by mouth daily., Disp: 90 tablet, Rfl: 3   sildenafil  (VIAGRA ) 100 MG tablet, Take 0.5-1 tablets (50-100 mg total) by mouth daily as needed for erectile dysfunction., Disp: 90 tablet, Rfl: 0   Cholecalciferol (VITAMIN D ) 50 MCG (2000 UT) CAPS, Take 1 capsule (2,000 Units total) by mouth daily. (Patient not taking: Reported on 12/20/2023), Disp: 30 capsule, Rfl: 0  Current Facility-Administered Medications:    lidocaine  (PF) (XYLOCAINE ) 1 % injection 2 mL, 2 mL, Other, Once,   Allergies  Allergen Reactions   Sucralfate Nausea Only and Other (See Comments)    DRY MOUTH ,FREQUENT URINATION   Celebrex [Celecoxib] Other (See Comments)    Felt dehydrated and  dry mouth     ROS  Ten systems reviewed and is negative except as mentioned in HPI    Objective  Vitals:   12/29/23 1058  BP: 114/62  Pulse: 92  Temp: 98 F (36.7 C)  TempSrc: Oral  SpO2: 96%  Weight: 221 lb 14.4 oz (100.7 kg)  Height: 5\' 8"  (1.727 m)    Body mass index is 33.74 kg/m.  Physical Exam CONSTITUTIONAL: Patient appears well-developed and well-nourished. No distress. HEENT: Head atraumatic, normocephalic, neck supple. CARDIOVASCULAR: Normal rate, regular rhythm and normal heart sounds. No murmur heard. No BLE edema. PULMONARY: Effort normal and breath sounds normal. No respiratory distress. PSYCHIATRIC: Patient has  a normal mood and affect. Behavior is normal. Judgment and thought content normal. SKIN: Small raised skin tags on neck appear benign.  Recent Results (from the past 2160 hours)  Surgical pathology     Status: None   Collection Time: 11/06/23 12:00 AM  Result Value Ref Range   SURGICAL PATHOLOGY      SURGICAL PATHOLOGY [facility] [address] [place], [place] [address] Telephone [phone] or [phone] Fax [phone]  REPORT OF SURGICAL PATHOLOGY   Accession #: [id number] Patient Name: TRYCE, GENAW Visit # : [id number]  MRN: [id number] Physician: Leida Puna DOB/Age [birth year] (Age: 55) Gender: M Collected Date: 11/06/2023 Received Date: 11/06/2023  FINAL DIAGNOSIS       1. Stomach, biopsy, random, cbx :       GASTRIC ANTRAL/OXYNTIC MUCOSA WITH NO SIGNIFICANT DIAGNOSTIC ALTERATION.      NO H. PYLORI IDENTIFIED ON H&E STAIN.      NEGATIVE FOR INTESTINAL METAPLASIA OR DYSPLASIA.       DATE SIGNED OUT: 11/07/2023 ELECTRONIC SIGNATURE : Dillard Frame Md, Pathologist, Electronic Signature  MICROSCOPIC DESCRIPTION  CASE COMMENTS STAINS USED IN DIAGNOSIS: H&E    CLINICAL HISTORY  SPECIMEN(S) OBTAINED 1. Stomach, biopsy, Random, Cbx  SPECIMEN COMMENTS: 1. R/O h pylori SPECIMEN CLINICAL INFORMATION:  1. LUQ abdominal pain, GERD    Gross Description 1. Received in formalin is a 1.0 x 0.8 x 0.2 cm aggregate of multiple soft, tan tissue fragments submitted entirely in block 1A.   (SB:kh 11/06/23)        Report signed out from the following location(s) [facility] [address], [place] [address] CLIA #: [id number]  [facility] [address] [place], [place] [address] CLIA #: [id number]   Lipid panel     Status: Abnormal   Collection Time: 12/20/23 10:14 AM  Result Value Ref Range   Cholesterol 183 <200 mg/dL   HDL 33 (L) > OR = 40 mg/dL   Triglycerides 220 <254  mg/dL   LDL Cholesterol (Calc) 125 (H) mg/dL (calc)    Comment: Reference range: <100 . Desirable range <100 mg/dL for primary prevention;   <70 mg/dL for patients with CHD or diabetic patients  with > or = 2 CHD risk factors. Aaron Aas LDL-C is now calculated using the Martin-Hopkins  calculation, which is a validated novel method providing  better accuracy than the Friedewald equation in the  estimation of LDL-C.  Melinda Sprawls et al. Erroll Heard. 2706;237(62): 2061-2068  (http://education.[facility].com/faq/FAQ164)    Total CHOL/HDL Ratio 5.5 (H) <5.0 (calc)   Non-HDL Cholesterol (Calc) 150 (H) <130 mg/dL (calc)    Comment: For patients with diabetes plus 1 major ASCVD risk  factor, treating to a non-HDL-C goal of <100 mg/dL  (LDL-C of <83 mg/dL)  is considered a therapeutic  option.   Hemoglobin A1c     Status: Abnormal   Collection Time: 12/20/23 10:14 AM  Result Value Ref Range   Hgb A1c MFr Bld 6.2 (H) <5.7 %    Comment: For someone without known diabetes, a hemoglobin  A1c value between 5.7% and 6.4% is consistent with prediabetes and should be confirmed with a  follow-up test. . For someone with known diabetes, a value <7% indicates that their diabetes is well controlled. A1c targets should be individualized based on duration of diabetes, age, comorbid conditions, and other considerations. . This assay result is consistent with an increased risk of diabetes. . Currently, no consensus exists regarding use of hemoglobin A1c for diagnosis of diabetes for children. .    Mean Plasma Glucose 131 mg/dL   eAG (mmol/L) 7.3 mmol/L  Comprehensive metabolic panel with GFR     Status: None   Collection Time: 12/20/23 10:14 AM  Result Value Ref Range   Glucose, Bld 91 65 - 99 mg/dL    Comment: .            Fasting reference interval .    BUN 17 7 - 25 mg/dL   Creat 1.61 0.96 - 0.45 mg/dL   eGFR 70 > OR = 60 WU/JWJ/1.91Y7   BUN/Creatinine Ratio SEE NOTE: 6 - 22 (calc)     Comment:    Not Reported: BUN and Creatinine are within    reference range. .    Sodium 139 135 - 146 mmol/L   Potassium 4.5 3.5 - 5.3 mmol/L   Chloride 101 98 - 110 mmol/L   CO2 30 20 - 32 mmol/L   Calcium 10.1 8.6 - 10.3 mg/dL   Total Protein 7.4 6.1 - 8.1 g/dL   Albumin 4.5 3.6 - 5.1 g/dL   Globulin 2.9 1.9 - 3.7 g/dL (calc)   AG Ratio 1.6 1.0 - 2.5 (calc)   Total Bilirubin 0.4 0.2 - 1.2 mg/dL   Alkaline phosphatase (APISO) 42 35 - 144 U/L   AST 25 10 - 35 U/L   ALT 35 9 - 46 U/L      Assessment & Plan Skin tags Multiple benign skin tags on neck causing irritation. Opted for cryotherapy with numbing due to potential pain. Explained numbing may sting initially but prevents pain during removal. - Perform cryotherapy with numbing for removal. - Discussed sending specimen to pathology but after discussion it was decided to not send it due to cost and benign appearance but to monitor for recurrence - Monitor for recurrence or inflammation. - Advise to report return or inflammation. - No dressing needed post-procedure; normal showering allowed.   Consent signed: YES  Procedure: Skin Mass Removal Location: neck - right 3  lesions, 2 lesions on left  Equipment used:  high temperature cautery,  tweezers, Anesthesia: 1% Lidocaine  w/o Epinephrine  Cleaned and prepped: Betadine  After consent signed, are of skin prepped with betadine. Lidocaine  w/o epinephrine injected into skin underneath skin mass. After properly numbed sterile equipment used to remove tag.  Specimen sent for pathology analysis. Instructed on proper care to allow for proper healing. F/U for nursing visit if needed.     -Myopathy [facility] at [phone]

## 2024-01-22 ENCOUNTER — Ambulatory Visit: Payer: Self-pay

## 2024-01-22 NOTE — Telephone Encounter (Signed)
 Pt states has been having diarrhea since taking metformin .

## 2024-01-22 NOTE — Telephone Encounter (Signed)
 FYI Only or Action Required?: Action required by provider  Patient was last seen in primary care on 12/29/2023 by Arleen Lacer, MD. Called Nurse Triage reporting Diarrhea. Symptoms began a week ago. Interventions attempted: Nothing. Symptoms are: unchanged.  Triage Disposition: See PCP When Office is Open (Within 3 Days)  Patient/caregiver understands and will follow disposition?: No, refuses disposition     Copied From CRM [id number]. Reason for Triage: Patient would like medication for Hemorrhoids called in to preferred pharmacy. He has had diarrhea since taking metFORMIN  (GLUCOPHAGE -XR) 500 MG 24 hr tablet  Callback #: [phone]  Preferred Pharmacy: [facility] [address] [place] - [address] [place] [address] Phone: [phone] Fax: [phone] Hours: Not open 24 hours   Reason for Disposition  [1] Mild diarrhea (e.g., 1-3 or more stools than normal in past 24 hours) without known cause AND [2] present >  7 days  Answer Assessment - Initial Assessment Questions 1. DIARRHEA SEVERITY: "How bad is the diarrhea?" "How many more stools have you had in the past 24 hours than normal?"    - NO DIARRHEA (SCALE 0)   - MILD (SCALE 1-3): Few loose or mushy BMs; increase of 1-3 stools over normal daily number of stools; mild increase in ostomy output.   -  MODERATE (SCALE 4-7): Increase of 4-6 stools daily over normal; moderate increase in ostomy output.   -  SEVERE (SCALE 8-10; OR "WORST POSSIBLE"): Increase of 7 or more stools daily over normal; moderate increase in ostomy output; incontinence.     mild 2. ONSET: "When did the diarrhea begin?"      About three days after taking metformin . 3. BM CONSISTENCY: "How loose or watery is the diarrhea?"      Loose and sometimes watery 4. VOMITING: "Are you also vomiting?" If Yes, ask: "How many times in the past 24 hours?"      no 5. ABDOMEN PAIN: "Are you having any abdomen pain?" If Yes, ask: "What does it  feel like?" (e.g., crampy, dull, intermittent, constant)      no 6. ABDOMEN PAIN SEVERITY: If present, ask: "How bad is the pain?"  (e.g., Scale 1-10; mild, moderate, or severe)   - MILD (1-3): doesn't interfere with normal activities, abdomen soft and not tender to touch    - MODERATE (4-7): interferes with normal activities or awakens from sleep, abdomen tender to touch    - SEVERE (8-10): excruciating pain, doubled over, unable to do any normal activities       no 7. ORAL INTAKE: If vomiting, "Have you been able to drink liquids?" "How much liquids have you had in the past 24 hours?"     denies 8. HYDRATION: "Any signs of dehydration?" (e.g., dry mouth [not just dry lips], too weak to stand, dizziness, new weight loss) "When did you last urinate?"     no 9. EXPOSURE: "Have you traveled to a foreign country recently?" "Have you been exposed to anyone with diarrhea?" "Could you have eaten any food that was spoiled?"     no 10. ANTIBIOTIC USE: "Are you taking antibiotics now or have you taken antibiotics in the past 2 months?"       no 11. OTHER SYMPTOMS: "Do you have any other symptoms?" (e.g., fever, blood in stool)       no 12. PREGNANCY: "Is there any chance you are pregnant?" "When was your last menstrual period?"       no  Protocols used:  Diarrhea-A-AH

## 2024-01-25 ENCOUNTER — Other Ambulatory Visit: Payer: Self-pay | Admitting: Nurse Practitioner

## 2024-01-25 DIAGNOSIS — U071 COVID-19: Secondary | ICD-10-CM

## 2024-01-26 NOTE — Telephone Encounter (Signed)
 Requested Prescriptions  Pending Prescriptions Disp Refills   cetirizine  (ZYRTEC ) 10 MG tablet [Pharmacy Med Name: CETIRIZINE  HCL 10 MG TABLET] 90 tablet 3    Sig: TAKE 1 TABLET BY MOUTH EVERY DAY     Ear, Nose, and Throat:  Antihistamines 2 Passed - 01/26/2024  1:11 PM      Passed - Cr in normal range and within 360 days    Creat  Date Value Ref Range Status  12/20/2023 1.25 0.70 - 1.30 mg/dL Final         Passed - Valid encounter within last 12 months    Recent Outpatient Visits           4 weeks ago Skin tag   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

## 2024-02-28 ENCOUNTER — Other Ambulatory Visit: Payer: Self-pay | Admitting: Family Medicine

## 2024-02-28 DIAGNOSIS — N528 Other male erectile dysfunction: Secondary | ICD-10-CM

## 2024-03-04 ENCOUNTER — Encounter: Payer: Self-pay | Admitting: Family Medicine

## 2024-03-05 ENCOUNTER — Other Ambulatory Visit: Payer: Self-pay

## 2024-03-05 DIAGNOSIS — N528 Other male erectile dysfunction: Secondary | ICD-10-CM

## 2024-03-05 MED ORDER — SILDENAFIL CITRATE 100 MG PO TABS: 50.0000 mg | ORAL_TABLET | Freq: Every day | ORAL | 0 refills | Status: DC | PRN

## 2024-06-12 ENCOUNTER — Other Ambulatory Visit: Payer: Self-pay | Admitting: Family Medicine

## 2024-06-12 DIAGNOSIS — R7303 Prediabetes: Secondary | ICD-10-CM

## 2024-07-01 ENCOUNTER — Telehealth: Payer: Self-pay | Admitting: Lab

## 2024-07-01 NOTE — Telephone Encounter (Signed)
 Patient states currently has inserts and in need of new ones what will be the process for him?

## 2024-07-08 ENCOUNTER — Ambulatory Visit: Payer: Self-pay | Admitting: Family Medicine

## 2024-07-08 ENCOUNTER — Encounter: Payer: Self-pay | Admitting: Family Medicine

## 2024-07-08 VITALS — BP 118/74 | HR 81 | Resp 16 | Ht 68.0 in | Wt 218.6 lb

## 2024-07-08 DIAGNOSIS — E785 Hyperlipidemia, unspecified: Secondary | ICD-10-CM

## 2024-07-08 DIAGNOSIS — K219 Gastro-esophageal reflux disease without esophagitis: Secondary | ICD-10-CM | POA: Diagnosis not present

## 2024-07-08 DIAGNOSIS — M0609 Rheumatoid arthritis without rheumatoid factor, multiple sites: Secondary | ICD-10-CM | POA: Diagnosis not present

## 2024-07-08 DIAGNOSIS — D84821 Immunodeficiency due to drugs: Secondary | ICD-10-CM | POA: Diagnosis not present

## 2024-07-08 DIAGNOSIS — M109 Gout, unspecified: Secondary | ICD-10-CM

## 2024-07-08 DIAGNOSIS — G729 Myopathy, unspecified: Secondary | ICD-10-CM

## 2024-07-08 DIAGNOSIS — R7303 Prediabetes: Secondary | ICD-10-CM

## 2024-07-08 LAB — POCT GLYCOSYLATED HEMOGLOBIN (HGB A1C): Hemoglobin A1C: 5.8 % — AB (ref 4.0–5.6)

## 2024-07-08 MED ORDER — METFORMIN HCL ER 500 MG PO TB24: 500.0000 mg | ORAL_TABLET | Freq: Every day | ORAL | 1 refills | Status: AC

## 2024-07-08 MED ORDER — PANTOPRAZOLE SODIUM 20 MG PO TBEC: 20.0000 mg | DELAYED_RELEASE_TABLET | Freq: Every day | ORAL | 3 refills | Status: AC

## 2024-07-08 NOTE — Progress Notes (Signed)
 Name: Dustin Brady   MRN: [id number]    DOB: 19-Nov-1970   Date:07/08/2024       Progress Note  Subjective  Chief Complaint  Chief Complaint  Patient presents with   Medical Management of Chronic Issues   Discussed the use of AI scribe software for clinical note transcription with the patient, who gave verbal consent to proceed.  History of Present Illness Dustin Brady is a 53 year old male with rheumatoid arthritis and prediabetes who presents for follow-up on his chronic conditions.  He is managing rheumatoid arthritis, diagnosed as idiopathic chronic gout, and is on long-term immunosuppressive therapy with methotrexate. Muscle aches have improved since seeing a specialist, although he continues modified work duties. Joint pain varies with activity, with a pain level of six on active days and five when resting. Morning stiffness is significant, rated at seven. His methotrexate dose was recently increased from five to eight pills, providing some relief.  He is managing prediabetes, with a recent improvement in his A1c from 6.2 to 5.8, attributed to dietary changes, including reducing sweet tea consumption and switching to diet sodas. He has lost weight, currently weighing 218.6 pounds, down from 220 pounds in May. He takes metformin , which initially caused diarrhea, but his bowel movements have returned to baseline.  He has a history of elevated cholesterol, with low HDL and slightly elevated LDL. He is not on cholesterol medication. No recent gout flares have occurred, and he is on allopurinol  for gout management.  He experiences occasional reflux and takes pantoprazole  20 mg daily, increasing the dose as needed when consuming trigger foods. He also uses Zyrtec  and Flonase  for allergies as needed.  He has a family history of muscle disease, prompting a visit to a neurologist at [facility] for evaluation of potential hereditary muscle disease. His daughter is also undergoing  evaluation, including a muscle biopsy.      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 ENDOSCOPY;  Service: Endoscopy;  Laterality: N/A;   ESOPHAGOGASTRODUODENOSCOPY (EGD) WITH PROPOFOL  N/A 11/06/2023   Procedure: ESOPHAGOGASTRODUODENOSCOPY (EGD) WITH PROPOFOL ;  Surgeon: Maryruth Ole DASEN, MD;  Location: [facility] ENDOSCOPY;  Service: Endoscopy;  Laterality: N/A;    Family History  Problem Relation Age of Onset   Hypertension Father    Diabetes Father    Stroke Father    Cancer Other     Social History   Tobacco Use   Smoking status: Never   Smokeless tobacco: Never  Substance Use Topics   Alcohol use: No     Current Outpatient Medications:    allopurinol  (ZYLOPRIM ) 300 MG tablet, Take 300 mg by mouth daily., Disp: , Rfl:     cetirizine  (ZYRTEC ) 10 MG tablet, TAKE 1 TABLET BY MOUTH EVERY DAY, Disp: 90 tablet, Rfl: 3   fluticasone  (FLONASE ) 50 MCG/ACT nasal spray, SPRAY 2 SPRAYS INTO EACH NOSTRIL EVERY DAY, Disp: 48 mL, Rfl: 0   folic acid (FOLVITE) 1 MG tablet, Take 1 mg by mouth daily., Disp: , Rfl:    metFORMIN  (GLUCOPHAGE -XR) 500 MG 24 hr tablet, Take 1 tablet (500 mg total) by mouth daily with breakfast., Disp: 90 tablet, Rfl: 1   methotrexate (RHEUMATREX) 2.5 MG tablet, SMARTSIG:5 Tablet(s) By Mouth Once a Week, Disp: , Rfl:    pantoprazole  (PROTONIX ) 20 MG tablet, Take 1 tablet (20 mg total) by mouth daily., Disp: 90 tablet, Rfl: 3   sildenafil  (VIAGRA ) 100 MG tablet, Take 0.5-1 tablets (50-100 mg total) by mouth daily as needed for erectile dysfunction., Disp: 90 tablet, Rfl: 0  Allergies  Allergen Reactions   Sucralfate Nausea Only and Other (See Comments)    DRY MOUTH ,FREQUENT URINATION   Celebrex [Celecoxib] Other (See Comments)    Felt dehydrated and dry mouth     I personally reviewed active problem list, medication list, allergies, family history with the patient/caregiver today.   ROS  Ten systems reviewed and is negative except as mentioned in HPI    Objective Physical Exam  CONSTITUTIONAL: Patient appears well-developed and well-nourished.  No distress. HEENT: Head atraumatic, normocephalic, neck supple. CARDIOVASCULAR: Normal rate, regular rhythm and normal heart sounds.  No murmur heard. No BLE edema. PULMONARY: Effort normal and breath sounds normal. No respiratory distress. ABDOMINAL: There is no tenderness or distention. MUSCULOSKELETAL: Normal gait. Without gross motor or sensory deficit. PSYCHIATRIC: Patient has a normal mood and affect. behavior is normal. Judgment and thought content normal.  Vitals:   07/08/24 0955  BP: 118/74  Pulse: 81  Resp: 16  SpO2: 97%  Weight: 218 lb 9.6 oz (99.2 kg)  Height: 5' 8 (1.727 m)    Body mass index is 33.24 kg/m.  Recent Results  (from the past 2160 hours)  POCT glycosylated hemoglobin (Hb A1C)     Status: Abnormal   Collection Time: 07/08/24 10:02 AM  Result Value Ref Range   Hemoglobin A1C 5.8 (A) 4.0 - 5.6 %   HbA1c POC (<> result, manual entry)     HbA1c, POC (prediabetic range)     HbA1c, POC (controlled diabetic range)        PHQ2/9:    07/08/2024    9:49 AM 12/29/2023   10:57 AM 12/20/2023    9:10 AM 07/06/2023   10:41 AM 04/06/2023    9:24 AM  Depression screen PHQ 2/9  Decreased Interest 0 0 0 0 0  Down, Depressed, Hopeless 0 0 0 0 0  PHQ - 2 Score 0 0 0 0 0  Altered sleeping  0  0   Tired, decreased energy  0  0   Change in appetite  0  0   Feeling bad or failure about yourself   0  0   Trouble concentrating  0  0   Moving slowly or fidgety/restless  0  0   Suicidal thoughts  0  0   PHQ-9 Score  0   0    Difficult doing work/chores  Not difficult at all        Data saved with a previous flowsheet row definition    phq 9 is negative  Fall Risk:    07/08/2024    9:49 AM 12/29/2023   10:56 AM 12/20/2023    9:06 AM 07/06/2023   10:41 AM 04/06/2023    9:24 AM  Fall Risk   Falls in the past year? 0 0 0 0 0  Number falls in past yr: 0 0 0    Injury with Fall? 0 0 0    Risk for fall due to : No Fall Risks No Fall Risks No Fall Risks No Fall Risks No Fall Risks  Follow up Falls evaluation completed Falls evaluation completed Falls prevention discussed;Education provided;Falls evaluation completed Falls prevention discussed       Assessment & Plan Rheumatoid arthritis, multiple sites without rheumatoid factor Persistent joint pain and stiffness with partial improvement from increased methotrexate dosage. Symptoms not fully controlled. - Continue methotrexate at increased dosage. - Monitor pain and stiffness levels. - Consider keeping a pain diary. - Educated on seeking medical attention if fever or severe illness occurs.  Idiopathic chronic gout Gout well-controlled with allopurinol .  No recent flares. - Continue allopurinol  as prescribed.  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 Awaiting muscle biopsy results. - Continue follow-up with neuromuscular clinic at [facility].  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 Advised vigilance for infection signs. - Educated on seeking medical attention if infection symptoms occur. - Advised to hold methotrexate if fever or severe illness.  Prediabetes on metformin  Improved A1c with weight loss and dietary changes. Metformin  well-tolerated. - Continue metformin  as prescribed. - Advised to hold metformin  if dehydration or illness occurs. - Encouraged continued dietary modifications and weight management.  Gastroesophageal reflux disease Reflux well-controlled with pantoprazole . Occasional extra dose for trigger foods. - Continue pantoprazole  20 mg daily. - Allow for occasional extra dose before trigger foods.  Hyperlipidemia Low HDL and slightly elevated LDL. No current medication due to low cardiovascular risk. - Encouraged dietary modifications including increased intake of fish, tree nuts, and plant-based foods. - Advised to avoid fried foods and red meat.

## 2024-07-13 ENCOUNTER — Encounter: Payer: Self-pay | Admitting: Family Medicine

## 2024-07-13 DIAGNOSIS — N528 Other male erectile dysfunction: Secondary | ICD-10-CM

## 2024-07-15 MED ORDER — SILDENAFIL CITRATE 100 MG PO TABS: 50.0000 mg | ORAL_TABLET | Freq: Every day | ORAL | 0 refills | Status: AC | PRN

## 2024-07-31 ENCOUNTER — Other Ambulatory Visit

## 2024-07-31 ENCOUNTER — Ambulatory Visit: Admitting: Podiatry

## 2024-07-31 DIAGNOSIS — Q666 Other congenital valgus deformities of feet: Secondary | ICD-10-CM | POA: Diagnosis not present

## 2024-07-31 NOTE — Progress Notes (Signed)
 Subjective:  Patient ID: Dustin Brady, male    DOB: Feb 12, 1971,  MRN: [id number]  Chief Complaint  Patient presents with   Foot Orthotics    Pt stated he would like to get new orthotics but he had to see a doctor before he could get them     53 y.o. male presents with the above complaint.  Patient presents with bilateral rigid pes planovalgus deformity.  He states he is doing okay.  He has not any foot pain.  He is orthotics are getting worn out he would like to do another pair.  He states the orthotics have been managing his pain really well.  Denies any other acute complaints.   Review of Systems: Negative except as noted in the HPI. Denies N/V/F/Ch.  Past Medical History:  Diagnosis Date   Allergy As a kid   Chronic reflux esophagitis 10/05/2016   Gastritis 10/05/2016   Mild to moderate chronic gastritis, EGD Jan 2018, Dr. Gaylyn   GERD (gastroesophageal reflux disease)    Current Medications[1]  Tobacco Use History[2]  Allergies[3] Objective:  There were no vitals filed for this visit. There is no height or weight on file to calculate BMI. Constitutional Well developed. Well nourished.  Vascular Dorsalis pedis pulses palpable bilaterally. Posterior tibial pulses palpable bilaterally. Capillary refill normal to all digits.  No cyanosis or clubbing noted. Pedal hair growth normal.  Neurologic Normal speech. Oriented to person, place, and time. Epicritic sensation to light touch grossly present bilaterally.  Dermatologic Nails well groomed and normal in appearance. No open wounds. No skin lesions.  Orthopedic: Bilateral pes planovalgus deformity with calcaneovalgus to many toes signs.  Unable to perform single and double heel raise.  Unable to return the calcaneus to neutral position.  This is a rigid pes planovalgus deformity   Radiographs: None Assessment:   1. Pes planovalgus    Plan:  Patient was evaluated and treated and all questions answered.  Pes  planovalgus/foot deformity -I explained to patient the etiology of pes planovalgus and relationship with heel pain/arch pain and various treatment options were discussed.  Given patient foot structure in the setting of heel pain/arch pain I believe patient will benefit from custom-made orthotics to help control the hindfoot motion support the arch of the foot and take the stress away from arches.  Patient agrees with the plan like to proceed with orthotics -Patient was casted for orthotics   No follow-ups on file.     [1]  Current Outpatient Medications:    allopurinol  (ZYLOPRIM ) 300 MG tablet, Take 300 mg by mouth daily., Disp: , Rfl:    cetirizine  (ZYRTEC ) 10 MG tablet, TAKE 1 TABLET BY MOUTH EVERY DAY, Disp: 90 tablet, Rfl: 3   fluticasone  (FLONASE ) 50 MCG/ACT nasal spray, SPRAY 2 SPRAYS INTO EACH NOSTRIL EVERY DAY, Disp: 48 mL, Rfl: 0   folic acid (FOLVITE) 1 MG tablet, Take 1 mg by mouth daily., Disp: , Rfl:    metFORMIN  (GLUCOPHAGE -XR) 500 MG 24 hr tablet, Take 1 tablet (500 mg total) by mouth daily with breakfast., Disp: 90 tablet, Rfl: 1   methotrexate (RHEUMATREX) 2.5 MG tablet, SMARTSIG:5 Tablet(s) By Mouth Once a Week, Disp: , Rfl:    pantoprazole  (PROTONIX ) 20 MG tablet, Take 1 tablet (20 mg total) by mouth daily., Disp: 90 tablet, Rfl: 3   sildenafil  (VIAGRA ) 100 MG tablet, Take 0.5-1 tablets (50-100 mg total) by mouth daily as needed for erectile dysfunction., Disp: 90 tablet, Rfl: 0 [2]  Social History Tobacco  Use  Smoking Status Never  Smokeless Tobacco Never  [3]  Allergies Allergen Reactions   Sucralfate Nausea Only and Other (See Comments)    DRY MOUTH ,FREQUENT URINATION   Celebrex [Celecoxib] Other (See Comments)    Felt dehydrated and dry mouth

## 2024-08-22 ENCOUNTER — Telehealth: Payer: Self-pay | Admitting: Podiatrist

## 2024-08-22 NOTE — Telephone Encounter (Signed)
 Patient has not heard from anyone regarding orthotics and would like to speak to someone about the status of his orthotics [phone]

## 2024-08-22 NOTE — Telephone Encounter (Signed)
 Orthotics are in [place] office. I spoke to [place] and he will pick up in [place]. Sending with Marjorie today to [place] office. Doesn't need an appointment.

## 2024-08-23 ENCOUNTER — Other Ambulatory Visit

## 2024-08-26 ENCOUNTER — Telehealth: Payer: Self-pay | Admitting: Podiatrist

## 2024-08-26 NOTE — Telephone Encounter (Signed)
 Patient came in and picked up his orthotics.

## 2024-12-20 ENCOUNTER — Encounter: Admitting: Family Medicine

## 2025-01-13 ENCOUNTER — Ambulatory Visit: Admitting: Family Medicine
